# Patient Record
Sex: Male | Born: 1970
Health system: Southern US, Community
[De-identification: ages and names within clinical notes are randomized; demographics above are authoritative.]

## PROBLEM LIST (undated history)

## (undated) DIAGNOSIS — C799 Secondary malignant neoplasm of unspecified site: Secondary | ICD-10-CM

## (undated) DIAGNOSIS — C439 Malignant melanoma of skin, unspecified: Secondary | ICD-10-CM

## (undated) HISTORY — DX: Secondary malignant neoplasm of unspecified site: C79.9

## (undated) HISTORY — DX: Malignant melanoma of skin, unspecified: C43.9

---

## 2020-01-02 ENCOUNTER — Other Ambulatory Visit (HOSPITAL_COMMUNITY): Payer: Self-pay | Admitting: Surgery

## 2020-01-02 ENCOUNTER — Other Ambulatory Visit: Payer: Self-pay | Admitting: Surgery

## 2020-01-02 DIAGNOSIS — R2232 Localized swelling, mass and lump, left upper limb: Secondary | ICD-10-CM

## 2020-01-03 ENCOUNTER — Other Ambulatory Visit: Payer: Self-pay

## 2020-01-03 ENCOUNTER — Ambulatory Visit
Admission: RE | Admit: 2020-01-03 | Discharge: 2020-01-03 | Disposition: A | Discharge status: Home / Self Care | Payer: Self-pay | Source: Ambulatory Visit | Attending: Surgery | Admitting: Surgery

## 2020-01-03 DIAGNOSIS — R2232 Localized swelling, mass and lump, left upper limb: Secondary | ICD-10-CM | POA: Insufficient documentation

## 2020-01-03 MED ORDER — GADOBUTROL 1 MMOL/ML IV SOLN
5.0000 mL | Freq: Once | INTRAVENOUS | Status: AC | PRN
  Administered 2020-01-03: 5 mL via INTRAVENOUS

## 2020-11-21 ENCOUNTER — Telehealth: Payer: Self-pay | Admitting: Nurse Practitioner

## 2020-11-21 NOTE — Telephone Encounter (Signed)
Called patient's brother, Threasa Beards, to offer to schedule a Palliative Consult in the home, no answer - left message with reason for call along with my name and call back number.  Also called patient and left message as well.

## 2020-11-25 ENCOUNTER — Telehealth: Payer: Self-pay | Admitting: Nurse Practitioner

## 2020-11-25 NOTE — Telephone Encounter (Signed)
Spoke with patient's brother, Eddie Dibbles, as well as patient regarding Palliative referral/services.  All questions were answered and patient was in agreement with scheduling visit.  I have scheduled an In-person Consult for 12/02/20 @ 11 AM

## 2020-12-02 ENCOUNTER — Other Ambulatory Visit: Payer: Medicaid Other | Admitting: Nurse Practitioner

## 2020-12-02 ENCOUNTER — Other Ambulatory Visit: Payer: Self-pay

## 2020-12-02 ENCOUNTER — Encounter: Payer: Self-pay | Admitting: Nurse Practitioner

## 2020-12-02 DIAGNOSIS — C799 Secondary malignant neoplasm of unspecified site: Secondary | ICD-10-CM

## 2020-12-02 DIAGNOSIS — Z515 Encounter for palliative care: Secondary | ICD-10-CM

## 2020-12-02 DIAGNOSIS — C439 Malignant melanoma of skin, unspecified: Secondary | ICD-10-CM

## 2020-12-02 NOTE — Progress Notes (Addendum)
[facility] Consult Note Telephone: [phone]  Fax: [phone]  PATIENT NAME: Nicholas Pruitt DOB: 1971-03-16 MRN: [id number]  PRIMARY CARE PROVIDER:   Franklyn Lor, MD  REFERRING PROVIDER:  Franklyn Lor, MD [place],  [place] [address]  RESPONSIBLE PARTY:   Self; Twin brother Nicholas Pruitt  I was asked by Dr Nicholas Pruitt to see Nicholas Pruitt to Palliative care consult for complex medical decision making.  1. Advance Care Planning; Discussed wishes to continue palliative radiation; will revisit goc next visit as Nicholas Pruitt was symptomatic, emotional would be better to further explore 5 wishes/code status at next visit since rapport established  12/03/2020 2:21pm Discussed case with Dr Nicholas Pruitt. After discussion with Dr Nicholas Pruitt would NOT recommend changing Morphine or agent. Please disregard recommendations would not be beneficial with non-compliance. Being initial PC home visit will need to continue to establish rapport to need further exploration of medical goals as with understanding of treatments, metastatic disease progression, wishes for care, possible Hospice when decision is made to stop treatments with ongoing discussion with [facility] team  May consider changing Morphine to Dilaudid or Methadone as he continues to have uncontrolled pain, concern for compliance. Also may consider Haldol for nausea/vomiting. Will contact Dr Nicholas Pruitt with recommendations  2. Goals of Care: Goals include to maximize quality of life and symptom management. Our advance care planning conversation included a discussion about:     The value and importance of advance care planning   Exploration of personal, cultural or spiritual beliefs that might influence medical decisions   Exploration of goals of care in the event of a sudden injury or illness   Identification and preparation of a healthcare agent   Review and  updating or creation of an  advance directive document.  3. Palliative care encounter; Palliative care encounter; Palliative medicine team will continue to support patient, patient's family, and medical team. Visit consisted of counseling and education dealing with the complex and emotionally intense issues of symptom management and palliative care in the setting of serious and potentially life-threatening illness  4. f/u 4 weeks if needed for ongoing discussions of complex medical decision making, nutrition, symptoms of nausea/pain, chronic disease management  I spent 70 minutes providing this consultation,  from 11:00am to 12:10pm. More than 50% of the time in this consultation was spent coordinating communication.   HISTORY OF PRESENT ILLNESS:  Nicholas Pruitt is a 49 y.o. year old male with multiple medical problems including Metastatic melanoma, mass left chest wall.  1 / 2021 with left exhilarating has been growing progressively over the prior-year with workout consistent with metastatic melanoma, PET avid lesions and left axilla, gallbladder fundus. 8 / 17 CT/ pet good response decrease size of exhilarating adenoma resolution of right in lingual nodes, resolution of right fundal gallbladder soft tissue. 11 / 8 / 2021 CT brain - for intracranial disease, CT increase size of left exhilarating and left subpectoral Mass, new soft tissue satellite nodules scheduled for simultaneous radiation oncology for palliative radiation. Recent separation from his wife in 19-Sep-2023 then she passed away due to acute alcohol liver failure. He does have a twin brother Nicholas Pruitt. [employer] POA brother Nicholas Pruitt. Nicholas Pruitt is Widowed. Nicholas Pruitt takes MsContin increase to 45 mg po bid, Oxycodone 10 mg po q3 hours PRN, Duloxetine increase to 60 mg daily, Gabapentin 100 mg tid. Visit on 12 / 15 / 2021 documented struggle with medication management pain  remains under poor control and clear use of prescribed MsContin, using  Oxycodone 5 mg 6 to 8 tablets a day. Severe nausea with vomiting for the last week though resolved. Question whether he is taking Duloxetine, Gabapentin or dexamethasone. 11 / 3 / 2021 albumin 3.6, total protein 7.8. Current weight 97 lbs with BMI 16.2. In-person Palliative initial visit with Nicholas Pruitt. Nicholas Pruitt at present was sitting on the couch in the basement his area where he lives. Nicholas Pruitt and I talked about purpose of Palliative care visit. Nicholas Pruitt in agreement. We talked about past medical history in the setting of chronic disease and progression. We talked about the cancer and current Palliative radiation treatment studies undergoing. We talked about upcoming treatment in 2 days. We talked about Palliative radiation. We talked about progression of cancer. We talked at length about symptoms of pain including current pain regiment. Nicholas Pruitt endorses talked about switching him to a fentanyl patch but that has not happened as of yet. Nicholas Pruitt talked about a possible block that oncology also discussed with him for pain management. We talked about nausea when she does continue to experience. Nicholas Pruitt endorses it is difficult for him to take his pain medication when he is nauseous and vomits. Nicholas Pruitt does try zofran and gets relief at times. Nicholas Pruitt endorses he feels like sometimes when he does not have any food on his stomach he becomes nauseous and vomiting as well as the pain making it worse and unable to eat. Nicholas Pruitt endorses the last few days he has been able to eat a little bit better but this morning he is very nauseous. We also talked about possibly recommending Haldol for the nausea and looking at changing the agent for pain as it appears as he has increased dosages of morphine it does not seem to be controlling his pain as well, possibly considered Dilaudid or methadone. We talked about his appetite, nutrition. We talked about what his daily routine. We talked about  sleep pattern has he shared sleep well but last night he did having weird dreams. Nicholas Pruitt talked about his dreams. We talked about medical goals of care. We talked about quality of life. Mr. Alderman talked at length about his father, twin brother Nicholas Pruitt. Mr. Bills talked about his two children a daughter and son. Mr. Woodmansee endorses his daughter he does not have much to do with as she has a drug problem. Mr. Aja endorses his son just recently graduated from high school. Mr. Mcandrew talked at length about his wife who he was married to for a year-and-a-half has recently passed away in 09/24/2023 due to alcoholism. Mr. Beever talked a lot about his life overall, mistakes that he has made in the past. We talked at length about quality of life. Most of Palliative visits supportive. Discussed coping strategies. We talked about role of Palliative care and plan of care. We talked about follow up Palliative care visit in 4 weeks if needed or soon as should he declined. Mr. Waddell in agreement, appointment schedule. Therapeutic listening, emotional support provided. Contact information. Questions answered just satisfaction. I spoke separately with Nicholas Pruitt his brother. We talked about Palliative care visit. We talked about resources that are available in addition to support. Nicholas Pruitt expressed he was thankful for Palliative care visit.  Palliative Care was asked to help address goals of care.   PPS: 50% HOSPICE ELIGIBILITY/DIAGNOSIS: TBD  PAST MEDICAL HISTORY: History reviewed. No pertinent past medical history.  SOCIAL HX:  Social History   Tobacco Use  . Smoking status: Not on file  . Smokeless tobacco: Not on file  Substance Use Topics  . Alcohol use: Not on file    ALLERGIES: No Known Allergies   PERTINENT MEDICATIONS:  No outpatient encounter medications on file as of 12/02/2020.   No facility-administered encounter medications on file as of 12/02/2020.    PHYSICAL EXAM:   General: frail  appearing, thin, male Cardiovascular: regular rate and rhythm Pulmonary: few wheezes Extremities: no edema, no joint deformities; muscle wasting Neurological: generalized weakness  Davier Tramell Ihor Gully, NP

## 2020-12-03 ENCOUNTER — Telehealth: Payer: Self-pay | Admitting: Nurse Practitioner

## 2020-12-03 NOTE — Telephone Encounter (Signed)
I called Dr Lolita Patella Palliative [facility], message left to return call pertaining to update on home PC initial visit.

## 2020-12-31 ENCOUNTER — Other Ambulatory Visit: Payer: Self-pay

## 2020-12-31 ENCOUNTER — Encounter: Payer: Self-pay | Admitting: Nurse Practitioner

## 2020-12-31 ENCOUNTER — Other Ambulatory Visit: Payer: Medicaid Other | Admitting: Nurse Practitioner

## 2020-12-31 DIAGNOSIS — Z515 Encounter for palliative care: Secondary | ICD-10-CM

## 2020-12-31 DIAGNOSIS — C439 Malignant melanoma of skin, unspecified: Secondary | ICD-10-CM

## 2020-12-31 DIAGNOSIS — C799 Secondary malignant neoplasm of unspecified site: Secondary | ICD-10-CM

## 2020-12-31 NOTE — Progress Notes (Signed)
[facility] Consult Note Telephone: [phone]  Fax: [phone]  PATIENT NAME: Nicholas Pruitt DOB: 11/09/1971 MRN: [id number]  PRIMARY CARE PROVIDER:   Franklyn Lor, MD  REFERRING PROVIDER:  Franklyn Lor, MD [place],  [place] [address]   RESPONSIBLE PARTY:   Self; Twin brother Ibrahem Volkman  1.Advance Care Planning; Full code, aggressive interventions  2. Pain/nause with vomiting, managed by [facility] team,   3. Goals of Care: Goals include to maximize quality of life and symptom management. Our advance care planning conversation included a discussion about:   The value and importance of advance care planning  Exploration of personal, cultural or spiritual beliefs that might influence medical decisions  Exploration of goals of care in the event of a sudden injury or illness  Identification and preparation of a healthcare agent  Review and updating or creation of anadvance directive document.  4.Palliative care encounter; Palliative care encounter; Palliative medicine team will continue to support patient, patient's family, and medical team. Visit consisted of counseling and education dealing with the complex and emotionally intense issues of symptom management and palliative care in the setting of serious and potentially life-threatening illness  5. f/u 4 weeks if needed for ongoing discussions of complex medical decision making, nutrition, symptoms of nausea/pain, chronic disease management  I spent 35 minutes providing this consultation,  from 10:00am to 10:30am. More than 50% of the time in this consultation was spent coordinating communication.   HISTORY OF PRESENT ILLNESS:  Rogerio Boutelle is a 50 y.o. year old male with multiple medical problems including Metastatic melanoma, mass left chest wall.  1 / 2021 with left exhilarating has been growing progressively over the prior-year with  workout consistent with metastatic melanoma, PET avid lesions and left axilla, gallbladder fundus. I called Mr. Estill Bakes for Palliative care follow-up visit confirm change to telemedicine telephonic is video not available. Mr Estill Bakes and I talked about recent visits at [facility] with Oncology. We talked about his wishes to possibly transfer to a closer Oncologist as it is becoming more difficult for him to travel come and get rides and gas prices. We talked about his upcoming appointment scheduled to have a block placed at [facility] for pain management. We talked about starting Fentanyl patch. Mr Estill Bakes endorses he feels like this may be beneficial. Mr. Estill Bakes endorses that it is helping his pain. Mr Estill Bakes and I talked about his appetite which remains declined. Mr Estill Bakes endorses he weighs 98 lb. Mr Estill Bakes endorses he used to drink a lot of alcohol in his past but has not been quite some time since his wife is passed. Mr Estill Bakes endorses he did not like sweets in the past but seems to Crave them now. Mr Estill Bakes endorses he has been eating a fair amount of candy. Mr Estill Bakes talked about his brother going to [employer] and getting some donuts for him. Mr Estill Bakes endorses he ate donuts, then eating stew that is brother made and threw it up. We talked about food that had substance and protein. We talked about limiting sweets, moderation. We talked about the nausea and vomiting. We talked about his current regimen. Mr Estill Bakes endorses early in the morning is harder as well as sudden movements crates nausea. We talked about the block hopefully helping. We talked about his mobility is he is very slow to move due to the tumor causing pain. We talked about physical therapy who had been working with him.  We talked about medical goals at care including aggressive versus conservative versus comfort care. We talked about upcoming treatments. We talked about role of Palliative care at length. We talked about family dynamics his relationship with his  brother and father. We talked about coping strategies including meditation. We talked about taking things one day at a time. We talked about expectations for self and self care. We talked about follow-up palliative care visit and 1 months if needed or sooner should he declined. Mr Estill Bakes endorses he was call the [facility] in [place] as it appears they did do a referral the schedule initial consultation. Appointment schedule per request. Therapeutic listening and emotional support provided. Palliative care visit today supportive counseling. Questions answered to satisfaction. Contact information provided.  Palliative Care was asked to help to continue to address goals of care.   CODE STATUS: Full code  PPS: 40% HOSPICE ELIGIBILITY/DIAGNOSIS: TBD  PAST MEDICAL HISTORY: History reviewed. No pertinent past medical history.  SOCIAL HX:  Social History   Tobacco Use  . Smoking status: Not on file  . Smokeless tobacco: Not on file  Substance Use Topics  . Alcohol use: Not on file    ALLERGIES: No Known Allergies   PERTINENT MEDICATIONS:  No outpatient encounter medications on file as of 12/31/2020.   No facility-administered encounter medications on file as of 12/31/2020.    PHYSICAL EXAM:   Deferred  Dashay Giesler Z Ruthetta Koopmann, NP

## 2021-01-08 ENCOUNTER — Other Ambulatory Visit: Payer: Self-pay | Admitting: Oncology

## 2021-01-08 ENCOUNTER — Encounter (INDEPENDENT_AMBULATORY_CARE_PROVIDER_SITE_OTHER): Payer: Self-pay

## 2021-01-08 ENCOUNTER — Encounter: Payer: Self-pay | Admitting: Oncology

## 2021-01-08 ENCOUNTER — Inpatient Hospital Stay: Payer: 59 | Attending: Oncology | Admitting: Oncology

## 2021-01-08 ENCOUNTER — Inpatient Hospital Stay: Payer: 59

## 2021-01-08 VITALS — BP 109/78 | HR 120 | Temp 98.5°F | Resp 18 | Wt 95.7 lb

## 2021-01-08 DIAGNOSIS — C439 Malignant melanoma of skin, unspecified: Secondary | ICD-10-CM | POA: Diagnosis present

## 2021-01-08 DIAGNOSIS — Z803 Family history of malignant neoplasm of breast: Secondary | ICD-10-CM | POA: Diagnosis not present

## 2021-01-08 DIAGNOSIS — Z79899 Other long term (current) drug therapy: Secondary | ICD-10-CM | POA: Insufficient documentation

## 2021-01-08 DIAGNOSIS — R112 Nausea with vomiting, unspecified: Secondary | ICD-10-CM | POA: Diagnosis not present

## 2021-01-08 DIAGNOSIS — C774 Secondary and unspecified malignant neoplasm of inguinal and lower limb lymph nodes: Secondary | ICD-10-CM | POA: Diagnosis present

## 2021-01-08 DIAGNOSIS — R634 Abnormal weight loss: Secondary | ICD-10-CM

## 2021-01-08 DIAGNOSIS — G893 Neoplasm related pain (acute) (chronic): Secondary | ICD-10-CM

## 2021-01-08 DIAGNOSIS — Z8051 Family history of malignant neoplasm of kidney: Secondary | ICD-10-CM | POA: Insufficient documentation

## 2021-01-08 DIAGNOSIS — C7989 Secondary malignant neoplasm of other specified sites: Secondary | ICD-10-CM | POA: Diagnosis not present

## 2021-01-08 DIAGNOSIS — R1110 Vomiting, unspecified: Secondary | ICD-10-CM | POA: Insufficient documentation

## 2021-01-08 DIAGNOSIS — C799 Secondary malignant neoplasm of unspecified site: Secondary | ICD-10-CM

## 2021-01-08 DIAGNOSIS — F17210 Nicotine dependence, cigarettes, uncomplicated: Secondary | ICD-10-CM | POA: Insufficient documentation

## 2021-01-08 DIAGNOSIS — Z7189 Other specified counseling: Secondary | ICD-10-CM

## 2021-01-08 LAB — CBC WITH DIFFERENTIAL/PLATELET
Abs Immature Granulocytes: 0.03 10*3/uL (ref 0.00–0.07)
Basophils Absolute: 0 10*3/uL (ref 0.0–0.1)
Basophils Relative: 0 %
Eosinophils Absolute: 0.3 10*3/uL (ref 0.0–0.5)
Eosinophils Relative: 4 %
HCT: 30.5 % — ABNORMAL LOW (ref 39.0–52.0)
Hemoglobin: 9.2 g/dL — ABNORMAL LOW (ref 13.0–17.0)
Immature Granulocytes: 0 %
Lymphocytes Relative: 12 %
Lymphs Abs: 0.9 10*3/uL (ref 0.7–4.0)
MCH: 24 pg — ABNORMAL LOW (ref 26.0–34.0)
MCHC: 30.2 g/dL (ref 30.0–36.0)
MCV: 79.6 fL — ABNORMAL LOW (ref 80.0–100.0)
Monocytes Absolute: 0.9 10*3/uL (ref 0.1–1.0)
Monocytes Relative: 11 %
Neutro Abs: 5.4 10*3/uL (ref 1.7–7.7)
Neutrophils Relative %: 73 %
Platelets: 439 10*3/uL — ABNORMAL HIGH (ref 150–400)
RBC: 3.83 MIL/uL — ABNORMAL LOW (ref 4.22–5.81)
RDW: 13.8 % (ref 11.5–15.5)
WBC: 7.6 10*3/uL (ref 4.0–10.5)
nRBC: 0 % (ref 0.0–0.2)

## 2021-01-08 LAB — COMPREHENSIVE METABOLIC PANEL
ALT: 5 U/L (ref 0–44)
AST: 17 U/L (ref 15–41)
Albumin: 3.3 g/dL — ABNORMAL LOW (ref 3.5–5.0)
Alkaline Phosphatase: 56 U/L (ref 38–126)
Anion gap: 14 (ref 5–15)
BUN: 9 mg/dL (ref 6–20)
CO2: 26 mmol/L (ref 22–32)
Calcium: 9.6 mg/dL (ref 8.9–10.3)
Chloride: 97 mmol/L — ABNORMAL LOW (ref 98–111)
Creatinine, Ser: 0.5 mg/dL — ABNORMAL LOW (ref 0.61–1.24)
GFR, Estimated: >60 mL/min (ref >60)
Glucose, Bld: 79 mg/dL (ref 70–99)
Potassium: 3.8 mmol/L (ref 3.5–5.1)
Sodium: 137 mmol/L (ref 135–145)
Total Bilirubin: 0.4 mg/dL (ref 0.3–1.2)
Total Protein: 8.3 g/dL — ABNORMAL HIGH (ref 6.5–8.1)

## 2021-01-08 LAB — LACTATE DEHYDROGENASE: LDH: 423 U/L — ABNORMAL HIGH (ref 98–192)

## 2021-01-08 MED ORDER — ONDANSETRON 8 MG PO TBDP: 8.0000 mg | ORAL_TABLET | Freq: Three times a day (TID) | ORAL | 0 refills | Status: AC | PRN

## 2021-01-08 NOTE — Progress Notes (Signed)
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

## 2021-01-08 NOTE — Progress Notes (Signed)
Patient here to establish care for metastatic melanoma. Transferring care from [facility] due to transportation issues.

## 2021-01-08 NOTE — Progress Notes (Signed)
Hematology/Oncology Consult note [facility] Telephone:([phone] [phone]   Patient Care Team: Franklyn Lor, MD as PCP - General (Internal Medicine)  REFERRING PROVIDER: Clemon Chambers, MD  CHIEF COMPLAINTS/REASON FOR VISIT:  Evaluation of metastatic melanoma.   HISTORY OF PRESENTING ILLNESS:   Nicholas Pruitt is a  50 y.o.  male with PMH listed below was seen in consultation at the request of  Clemon Chambers, MD  for evaluation of metastatic melanoma.  Patient's oncology care was previously at [facility]. Most history was obtained from reviewing her records via careeverywhere as patient does not feel well due to the pain.  He was accompanied by his twin brother who he lives with.   1. Metastatic Melanoma, axillary mass, left A. 01/02/20 Dr. Lysle Pearl, evaluation of axillary mass, left B. 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 C. 01/24/20 Dr. Lorayne Marek (orthopedic surgery), seen in evaluation for further evaluation-need for bx D. 01/25/20: CT Chest: at least 2 large discrete oval left axillary and left supectoral masses, slightly increased in size compared to 01/03/20 MRI. Left upper lobe 5 mm groundglass pulmonary nodule is inderterminate-f/u recommended. E. 01/29/20: Dr. Lorayne Marek, Left axilla mass, biopsy: Most consistent with metastatic melanoma -diffusely positive for SOX10, focal positive for S100 and negative for pancytokeratin and TTF1 -Immunohistochemistry for Mart/HMB45 and MiTF is strong and diffusely positive, further supporting the diagnosis of metastatic melanoma. - [id number] F. 02/14/20: PET/CT:large left axillary and left subpectoral hypermetabolic mass, consistent with biopsy proven melanoma. Hypermetabolic soft tissue mass in gallbladder fundus concerning for metastatic or primary malignancy. Metastatic right inguinal lymph nodes.  Hypermetabolic left thyroid lobe nodule. Small left upper lobe ground glass nodule without FDG activity.   G. 04/03/20: Encorafenib/binimetinib recently approved with financial assistance. Pain management discussed.  H. 04/17/20 Followup visit. Taking medications incorrectly. Went over dosing with encorafenib 6 pills daily and binimetinib 3 tablets twice a day. Pain has improved. Left axillary mass has improved.  I. 06/03/20 Ct c/a/p: increased size and central necrosis of the subpectoral component of the left axillary mass, lateral axillary component unchanged, decrease in size in right inguinal adenopathy J. 07/02/20 Echo: LV ejection fraction: 50%. Enco/bini 450/45mg , hold treatment for 3 days, restart at 300/30 mg. Written instructions given. K. 07/30/20 PET CT: decrease in size of axillary adenopathy, resolution of R inguinal node, resolution of R fundal gallbladder soft tissue L. 09/11/20 ECHO: Ejection fraction 44% Enco/bini 300/30 mg will restart in next few days. Change in specialty pharmacy, enco/bini on hold for last three weeks.  M. 10/16/20 followup visit. Saw XRT prior to seeing me. Started enco/bini 09/13/20, not taking consistently due to nausea/vomiting so has missed several doses. Referred to palliative, instructed to stop enco/bini as he was not deriving benefit  N. 10/21/20 CT brain- no intracranial disease CT c/a/p: Increased size and enhancement of left axillary and left subpectoral mass which is now confluent. now measuring at least 10.6 x 7.8 x 12.2 cm, previously 6.7 x 8.1 x 10.6 cm. Several new small soft tissue satellite nodules. O. 10/30/20 XRT to axilla  P. 11/27/20 follow up visit. He has completed 2 of 3 doses of radiation to left axilla.  Q. 12/16/20 completed radiation  Pain, left axially mass pain, he is on Fentanyl Patch 59mcg /h every 72 hours, and oxycodone 10 mg every 4 hours. Still has 7-8 out of 10 pain, radiate to his anterior chest wall and  left flank.   Nausea,  currently on Zofran and compazine. He reports antiemetics are not working as he vomits the medication out. He smokes Marijuana daily.    Review of Systems  Constitutional: Positive for appetite change, fatigue and unexpected weight change. Negative for chills and fever.  HENT:   Negative for hearing loss and voice change.   Eyes: Negative for eye problems and icterus.  Respiratory: Negative for chest tightness, cough and shortness of breath.   Cardiovascular: Negative for chest pain and leg swelling.  Gastrointestinal: Positive for nausea and vomiting. Negative for abdominal distention and abdominal pain.  Endocrine: Negative for hot flashes.  Genitourinary: Negative for difficulty urinating, dysuria and frequency.   Musculoskeletal: Negative for arthralgias.       Left axillary mass pain  Skin: Negative for itching and rash.  Neurological: Negative for light-headedness and numbness.  Hematological: Negative for adenopathy. Does not bruise/bleed easily.  Psychiatric/Behavioral: Negative for confusion.    MEDICAL HISTORY:  Past Medical History:  Diagnosis Date  . Metastatic melanoma ([place])     SURGICAL HISTORY: History reviewed. No pertinent surgical history.  SOCIAL HISTORY: Social History   Socioeconomic History  . Marital status: Widowed    Spouse name: Not on file  . Number of children: Not on file  . Years of education: Not on file  . Highest education level: Not on file  Occupational History  . Occupation: disabled  Tobacco Use  . Smoking status: Current Every Day Smoker    Packs/day: 1.50    Years: 20.00    Pack years: 30.00    Types: Cigarettes  . Smokeless tobacco: Never Used  Vaping Use  . Vaping Use: Never used  Substance and Sexual Activity  . Alcohol use: Not Currently  . Drug use: Yes    Types: Marijuana    Comment: smokes marijuana daily  . Sexual activity: Not on file  Other Topics Concern  . Not on file  Social History Narrative  . Not on file    Social Determinants of Health   Financial Resource Strain: Not on file  Food Insecurity: Not on file  Transportation Needs: Not on file  Physical Activity: Not on file  Stress: Not on file  Social Connections: Not on file  Intimate Partner Violence: Not on file    FAMILY HISTORY: Family History  Problem Relation Age of Onset  . Breast cancer Mother   . Kidney cancer Mother     ALLERGIES:  has No Known Allergies.  MEDICATIONS:  Current Outpatient Medications  Medication Sig Dispense Refill  . Docusate Sodium (DSS) 100 MG CAPS Take 100 mg by mouth in the morning and at bedtime.    . fentaNYL (DURAGESIC) 25 MCG/HR PLACE 1 PATCH ONTO THE SKIN EVERY THIRD DAY    . gabapentin (NEURONTIN) 100 MG capsule Take 100 mg by mouth in the morning, at noon, and at bedtime.    . naloxone (NARCAN) nasal spray 4 mg/0.1 mL Place into the nose. Place 1 spray (4 mg total) into one nostril once as needed (if not breathing or overdose is suspected.) for up to 1 dose Give 2nd dose in 5-10 min if not responding or if sx return for up to 1 dose    . ondansetron (ZOFRAN ODT) 8 MG disintegrating tablet Take 1 tablet (8 mg total) by mouth every 8 (eight) hours as needed for nausea or vomiting. 90 tablet 0  . oxyCODONE (OXY IR/ROXICODONE) 5 MG immediate release tablet Take by  mouth. Take 1-2 tablets (5-10 mg total) by mouth every 4 (four) hours as needed (cancer related pain    . potassium chloride (KLOR-CON) 10 MEQ tablet Take 10 mEq by mouth 2 (two) times daily.    . prochlorperazine (COMPAZINE) 10 MG tablet Take by mouth. Take 1 tablet (10 mg total) by mouth every 6 (six) hours as needed for Nausea     No current facility-administered medications for this visit.     PHYSICAL EXAMINATION: ECOG PERFORMANCE STATUS: 1 - Symptomatic but completely ambulatory Vitals:   01/08/21 1534  BP: 109/78  Pulse: (!) 120  Resp: 18  Temp: 98.5 F (36.9 C)   Filed Weights   01/08/21 1534  Weight: 95 lb 11.2 oz  (43.4 kg)    Physical Exam Constitutional:      Comments: Some distress due to the pain.  HENT:     Head: Normocephalic.  Eyes:     General: No scleral icterus. Cardiovascular:     Rate and Rhythm: Normal rate and regular rhythm.     Heart sounds: Normal heart sounds.  Pulmonary:     Effort: Pulmonary effort is normal. No respiratory distress.     Breath sounds: No wheezing.  Abdominal:     General: Bowel sounds are normal. There is no distension.     Palpations: Abdomen is soft.  Musculoskeletal:        General: No deformity. Normal range of motion.     Cervical back: Normal range of motion and neck supple.     Comments: Left axillary mass with focal erythema. Tender with palpation.   Skin:    General: Skin is warm and dry.  Neurological:     Mental Status: He is alert and oriented to person, place, and time. Mental status is at baseline.     Cranial Nerves: No cranial nerve deficit.     Coordination: Coordination normal.       LABORATORY DATA:  I have reviewed the data as listed Lab Results  Component Value Date   WBC 7.6 01/08/2021   HGB 9.2 (L) 01/08/2021   HCT 30.5 (L) 01/08/2021   MCV 79.6 (L) 01/08/2021   PLT 439 (H) 01/08/2021   Recent Labs    01/08/21 1627  NA 137  K 3.8  CL 97*  CO2 26  GLUCOSE 79  BUN 9  CREATININE 0.50*  CALCIUM 9.6  GFRNONAA >60  PROT 8.3*  ALBUMIN 3.3*  AST 17  ALT 5  ALKPHOS 56  BILITOT 0.4   Iron/TIBC/Ferritin/ %Sat No results found for: IRON, TIBC, FERRITIN, IRONPCTSAT    RADIOGRAPHIC STUDIES: I have personally reviewed the radiological images as listed and agreed with the findings in the report. No results found.  I reviewed his previous CT and PET scans that were done at [facility]. Images were not available.  ASSESSMENT & PLAN:  1. Metastatic melanoma ([place])   2. Goals of care, counseling/discussion   3. Non-intractable vomiting with nausea, unspecified vomiting type   4. Weight loss   5. Neoplasm related pain    Cancer Staging Metastatic melanoma [facility]) Staging form: Melanoma of the Skin, AJCC 8th Edition - Clinical stage from 01/08/2021: Stage IV (ycTX, cNX, cM1) - Signed by Earlie Server, MD on 01/08/2021   # Stage IV metastatic melanoma, BRAFV600E Progressed on 1st treatment with Encorafenib/binimetinib, s/p palliative RT.  The diagnosis and care plan were discussed with patient in detail.  NCCN guidelines were reviewed and shared with patient.   The  goal of treatment which is to palliate disease, disease related symptoms, improve quality of life and hopefully prolong life was highlighted in our discussion.  Chemotherapy education was provided.  We had discussed the composition of chemotherapy regimen, length of chemo cycle, duration of treatment and the time to assess response to treatment.    I discussed the mechanism of action and rationale of using immunotherapy Nivolumab and ipilimumab.  The goal of therapy is palliative; and length of treatments are likely ongoing/based upon the results of the scans. Discussed the potential side effects of immunotherapy including but not limited to allergic reaction, diarrhea; skin rash; respiratory failure, kidney failure, mental status change, elevated LFTs/liver failure,endocrine abnormalities, acute deterioration  and even death,etc. patient voices understanding and agrees with proceeding with treatment.  Patient voices understanding and willing to proceed immunotherapy. He has had CT scheduled at [facility] and I recommend him to keep his appointment. Will obtain his images.  # Chemotherapy education # Nausea/vomiting He reports previously Zofran ODT works but currently he get Zofran tablets which he has difficulty keeping down.  Switch to Zofran ODT 8mg  Q8hours as needed.  Also discussed other options of antiemetic, phenergan suppository, IV antiemetics and he is not interested at this point.  # Neoplasm related pain.  Recommend him to continue Oxycodone 5-10mg  Q4  hours as needed.  Increase fentanyl patch to 26mcg/h every 72 hours.  Currently on gabapentin 100mg  TID and I recommend him to increase to 200mg  TID and can further titrate up if he tolerates. I did ask him to increase fentanyl patch dose first, if tolerate, then increase gabapentin.  Recommend NSAID and he is not interested.  # Weight loss, recommend nutrition supplement.  # refer to establish care with palliative care service.   Check CBC, CMP LDH,  Follow up next week, hopefully to start first cycle of Nivolumab and Ipilimumab Supportive care measures are necessary for patient well-being and will be provided as necessary. We spent sufficient time to discuss many aspect of care, questions were answered to patient's satisfaction.   Orders Placed This Encounter  Procedures  . CBC with Differential/Platelet    Standing Status:   Future    Number of Occurrences:   1    Standing Expiration Date:   01/08/2022  . Comprehensive metabolic panel    Standing Status:   Future    Number of Occurrences:   1    Standing Expiration Date:   01/08/2022  . Lactate dehydrogenase    Standing Status:   Future    Number of Occurrences:   1    Standing Expiration Date:   01/08/2022  . Ambulatory Referral to Palliative Care    Referral Priority:   Routine    Referral Type:   Consultation    Number of Visits Requested:   1    All questions were answered. The patient knows to call the clinic with any problems questions or concerns.  cc Clemon Chambers, MD   Thank you for this kind referral and the opportunity to participate in the care of this patient. A copy of today's note is routed to referring provider    Earlie Server, MD, PhD Hematology Oncology [facility] Pager- [phone] 01/08/2021

## 2021-01-10 ENCOUNTER — Other Ambulatory Visit: Payer: 59

## 2021-01-10 NOTE — Patient Instructions (Signed)
Ipilimumab injection What is this medicine? IPILIMUMAB (IP i LIM ue mab) is a monoclonal antibody. It is used to treat colorectal cancer, kidney cancer, liver cancer, lung cancer, melanoma, and mesothelioma. This medicine may be used for other purposes; ask your health care provider or pharmacist if you have questions. COMMON BRAND NAME(S): YERVOY What should I tell my health care provider before I take this medicine? They need to know if you have any of these conditions:  autoimmune diseases like Crohn's disease, ulcerative colitis, or lupus  have had or planning to have an allogeneic stem cell transplant (uses someone else's stem cells)  history of organ transplant  nervous system problems like myasthenia gravis or Guillain-Barre syndrome  an unusual or allergic reaction to ipilimumab, other medicines, foods, dyes, or preservatives  pregnant or trying to get pregnant  breast-feeding How should I use this medicine? This medicine is for infusion into a vein. It is given by a health care professional in a hospital or clinic setting. A special MedGuide will be given to you before each treatment. Be sure to read this information carefully each time. Talk to your pediatrician regarding the use of this medicine in children. While this drug may be prescribed for children as young as 12 years for selected conditions, precautions do apply. Overdosage: If you think you have taken too much of this medicine contact a poison control center or emergency room at once. NOTE: This medicine is only for you. Do not share this medicine with others. What if I miss a dose? It is important not to miss your dose. Call your doctor or health care professional if you are unable to keep an appointment. What may interact with this medicine? Interactions are not expected. This list may not describe all possible interactions. Give your health care provider a list of all the medicines, herbs, non-prescription drugs,  or dietary supplements you use. Also tell them if you smoke, drink alcohol, or use illegal drugs. Some items may interact with your medicine. What should I watch for while using this medicine? Tell your doctor or healthcare professional if your symptoms do not start to get better or if they get worse. Do not become pregnant while taking this medicine or for 3 months after stopping it. Women should inform their doctor if they wish to become pregnant or think they might be pregnant. There is a potential for serious side effects to an unborn child. Talk to your health care professional or pharmacist for more information. Do not breast-feed an infant while taking this medicine or for 3 months after the last dose. Your condition will be monitored carefully while you are receiving this medicine. You may need blood work done while you are taking this medicine. What side effects may I notice from receiving this medicine? Side effects that you should report to your doctor or health care professional as soon as possible:  allergic reactions like skin rash, itching or hives, swelling of the face, lips, or tongue  black, tarry stools  bloody or watery diarrhea  changes in vision  dizziness  eye pain  fast, irregular heartbeat  feeling anxious  feeling faint or lightheaded, falls  nausea, vomiting  pain, tingling, numbness in the hands or feet  redness, blistering, peeling or loosening of the skin, including inside the mouth  signs and symptoms of liver injury like dark yellow or brown urine; general ill feeling or flu-like symptoms; light-colored stools; loss of appetite; nausea; right upper belly pain; unusually weak  or tired; yellowing of the eyes or skin  unusual bleeding or bruising Side effects that usually do not require medical attention (report to your doctor or health care professional if they continue or are bothersome):  headache  loss of appetite  trouble sleeping This list  may not describe all possible side effects. Call your doctor for medical advice about side effects. You may report side effects to FDA at 1-800-FDA-1088. Where should I keep my medicine? This drug is given in a hospital or clinic and will not be stored at home. NOTE: This sheet is a summary. It may not cover all possible information. If you have questions about this medicine, talk to your doctor, pharmacist, or health care provider.  2021 Elsevier/Gold Standard (2019-11-01 18:53:00) Nivolumab injection What is this medicine? NIVOLUMAB (nye VOL ue mab) is a monoclonal antibody. It treats certain types of cancer. Some of the cancers treated are colon cancer, head and neck cancer, Hodgkin lymphoma, lung cancer, and melanoma. This medicine may be used for other purposes; ask your health care provider or pharmacist if you have questions. COMMON BRAND NAME(S): Opdivo What should I tell my health care provider before I take this medicine? They need to know if you have any of these conditions:  autoimmune diseases like Crohn's disease, ulcerative colitis, or lupus  have had or planning to have an allogeneic stem cell transplant (uses someone else's stem cells)  history of chest radiation  history of organ transplant  nervous system problems like myasthenia gravis or Guillain-Barre syndrome  an unusual or allergic reaction to nivolumab, other medicines, foods, dyes, or preservatives  pregnant or trying to get pregnant  breast-feeding How should I use this medicine? This medicine is for infusion into a vein. It is given by a health care professional in a hospital or clinic setting. A special MedGuide will be given to you before each treatment. Be sure to read this information carefully each time. Talk to your pediatrician regarding the use of this medicine in children. While this drug may be prescribed for children as young as 12 years for selected conditions, precautions do apply. Overdosage:  If you think you have taken too much of this medicine contact a poison control center or emergency room at once. NOTE: This medicine is only for you. Do not share this medicine with others. What if I miss a dose? It is important not to miss your dose. Call your doctor or health care professional if you are unable to keep an appointment. What may interact with this medicine? Interactions have not been studied. This list may not describe all possible interactions. Give your health care provider a list of all the medicines, herbs, non-prescription drugs, or dietary supplements you use. Also tell them if you smoke, drink alcohol, or use illegal drugs. Some items may interact with your medicine. What should I watch for while using this medicine? This drug may make you feel generally unwell. Continue your course of treatment even though you feel ill unless your doctor tells you to stop. You may need blood work done while you are taking this medicine. Do not become pregnant while taking this medicine or for 5 months after stopping it. Women should inform their doctor if they wish to become pregnant or think they might be pregnant. There is a potential for serious side effects to an unborn child. Talk to your health care professional or pharmacist for more information. Do not breast-feed an infant while taking this medicine or for  5 months after stopping it. What side effects may I notice from receiving this medicine? Side effects that you should report to your doctor or health care professional as soon as possible:  allergic reactions like skin rash, itching or hives, swelling of the face, lips, or tongue  breathing problems  blood in the urine  bloody or watery diarrhea or black, tarry stools  changes in emotions or moods  changes in vision  chest pain  cough  dizziness  feeling faint or lightheaded, falls  fever, chills  headache with fever, neck stiffness, confusion, loss of memory,  sensitivity to light, hallucination, loss of contact with reality, or seizures  joint pain  mouth sores  redness, blistering, peeling or loosening of the skin, including inside the mouth  severe muscle pain or weakness  signs and symptoms of high blood sugar such as dizziness; dry mouth; dry skin; fruity breath; nausea; stomach pain; increased hunger or thirst; increased urination  signs and symptoms of kidney injury like trouble passing urine or change in the amount of urine  signs and symptoms of liver injury like dark yellow or brown urine; general ill feeling or flu-like symptoms; light-colored stools; loss of appetite; nausea; right upper belly pain; unusually weak or tired; yellowing of the eyes or skin  swelling of the ankles, feet, hands  trouble passing urine or change in the amount of urine  unusually weak or tired  weight gain or loss Side effects that usually do not require medical attention (report to your doctor or health care professional if they continue or are bothersome):  bone pain  constipation  decreased appetite  diarrhea  muscle pain  nausea, vomiting  tiredness This list may not describe all possible side effects. Call your doctor for medical advice about side effects. You may report side effects to FDA at 1-800-FDA-1088. Where should I keep my medicine? This drug is given in a hospital or clinic and will not be stored at home. NOTE: This sheet is a summary. It may not cover all possible information. If you have questions about this medicine, talk to your doctor, pharmacist, or health care provider.  2021 Elsevier/Gold Standard (2020-04-03 10:08:25)

## 2021-01-13 ENCOUNTER — Inpatient Hospital Stay: Payer: 59

## 2021-01-16 ENCOUNTER — Inpatient Hospital Stay (HOSPITAL_BASED_OUTPATIENT_CLINIC_OR_DEPARTMENT_OTHER): Payer: 59 | Admitting: Oncology

## 2021-01-16 ENCOUNTER — Inpatient Hospital Stay: Payer: 59 | Admitting: Hospice and Palliative Medicine

## 2021-01-16 ENCOUNTER — Encounter: Payer: Self-pay | Admitting: Oncology

## 2021-01-16 ENCOUNTER — Inpatient Hospital Stay: Payer: 59

## 2021-01-16 ENCOUNTER — Telehealth: Payer: Self-pay

## 2021-01-16 VITALS — BP 114/77 | HR 106 | Temp 98.0°F | Resp 18 | Wt 89.6 lb

## 2021-01-16 DIAGNOSIS — C779 Secondary and unspecified malignant neoplasm of lymph node, unspecified: Secondary | ICD-10-CM | POA: Insufficient documentation

## 2021-01-16 DIAGNOSIS — E46 Unspecified protein-calorie malnutrition: Secondary | ICD-10-CM | POA: Insufficient documentation

## 2021-01-16 DIAGNOSIS — Z923 Personal history of irradiation: Secondary | ICD-10-CM | POA: Insufficient documentation

## 2021-01-16 DIAGNOSIS — C799 Secondary malignant neoplasm of unspecified site: Secondary | ICD-10-CM

## 2021-01-16 DIAGNOSIS — R5383 Other fatigue: Secondary | ICD-10-CM | POA: Insufficient documentation

## 2021-01-16 DIAGNOSIS — C773 Secondary and unspecified malignant neoplasm of axilla and upper limb lymph nodes: Secondary | ICD-10-CM | POA: Diagnosis present

## 2021-01-16 DIAGNOSIS — C439 Malignant melanoma of skin, unspecified: Secondary | ICD-10-CM

## 2021-01-16 DIAGNOSIS — R634 Abnormal weight loss: Secondary | ICD-10-CM | POA: Insufficient documentation

## 2021-01-16 DIAGNOSIS — C4359 Malignant melanoma of other part of trunk: Secondary | ICD-10-CM | POA: Insufficient documentation

## 2021-01-16 DIAGNOSIS — Z510 Encounter for antineoplastic radiation therapy: Secondary | ICD-10-CM | POA: Insufficient documentation

## 2021-01-16 DIAGNOSIS — F17210 Nicotine dependence, cigarettes, uncomplicated: Secondary | ICD-10-CM | POA: Insufficient documentation

## 2021-01-16 DIAGNOSIS — G893 Neoplasm related pain (acute) (chronic): Secondary | ICD-10-CM | POA: Insufficient documentation

## 2021-01-16 DIAGNOSIS — R112 Nausea with vomiting, unspecified: Secondary | ICD-10-CM | POA: Diagnosis not present

## 2021-01-16 DIAGNOSIS — R531 Weakness: Secondary | ICD-10-CM | POA: Insufficient documentation

## 2021-01-16 DIAGNOSIS — Z5112 Encounter for antineoplastic immunotherapy: Secondary | ICD-10-CM | POA: Insufficient documentation

## 2021-01-16 DIAGNOSIS — Z515 Encounter for palliative care: Secondary | ICD-10-CM | POA: Insufficient documentation

## 2021-01-16 DIAGNOSIS — Z79899 Other long term (current) drug therapy: Secondary | ICD-10-CM | POA: Insufficient documentation

## 2021-01-16 DIAGNOSIS — Z7189 Other specified counseling: Secondary | ICD-10-CM

## 2021-01-16 DIAGNOSIS — Z9221 Personal history of antineoplastic chemotherapy: Secondary | ICD-10-CM | POA: Insufficient documentation

## 2021-01-16 LAB — CBC WITH DIFFERENTIAL/PLATELET
Abs Immature Granulocytes: 0.09 10*3/uL — ABNORMAL HIGH (ref 0.00–0.07)
Basophils Absolute: 0 10*3/uL (ref 0.0–0.1)
Basophils Relative: 0 %
Eosinophils Absolute: 0.3 10*3/uL (ref 0.0–0.5)
Eosinophils Relative: 4 %
HCT: 30.3 % — ABNORMAL LOW (ref 39.0–52.0)
Hemoglobin: 9.2 g/dL — ABNORMAL LOW (ref 13.0–17.0)
Immature Granulocytes: 1 %
Lymphocytes Relative: 11 %
Lymphs Abs: 0.9 10*3/uL (ref 0.7–4.0)
MCH: 23.8 pg — ABNORMAL LOW (ref 26.0–34.0)
MCHC: 30.4 g/dL (ref 30.0–36.0)
MCV: 78.3 fL — ABNORMAL LOW (ref 80.0–100.0)
Monocytes Absolute: 0.8 10*3/uL (ref 0.1–1.0)
Monocytes Relative: 9 %
Neutro Abs: 6.4 10*3/uL (ref 1.7–7.7)
Neutrophils Relative %: 75 %
Platelets: 455 10*3/uL — ABNORMAL HIGH (ref 150–400)
RBC: 3.87 MIL/uL — ABNORMAL LOW (ref 4.22–5.81)
RDW: 13.6 % (ref 11.5–15.5)
WBC: 8.5 10*3/uL (ref 4.0–10.5)
nRBC: 0 % (ref 0.0–0.2)

## 2021-01-16 LAB — COMPREHENSIVE METABOLIC PANEL
ALT: 7 U/L (ref 0–44)
AST: 17 U/L (ref 15–41)
Albumin: 3.3 g/dL — ABNORMAL LOW (ref 3.5–5.0)
Alkaline Phosphatase: 55 U/L (ref 38–126)
Anion gap: 13 (ref 5–15)
BUN: 12 mg/dL (ref 6–20)
CO2: 26 mmol/L (ref 22–32)
Calcium: 9.9 mg/dL (ref 8.9–10.3)
Chloride: 99 mmol/L (ref 98–111)
Creatinine, Ser: 0.63 mg/dL (ref 0.61–1.24)
GFR, Estimated: >60 mL/min (ref >60)
Glucose, Bld: 110 mg/dL — ABNORMAL HIGH (ref 70–99)
Potassium: 4.3 mmol/L (ref 3.5–5.1)
Sodium: 138 mmol/L (ref 135–145)
Total Bilirubin: 0.6 mg/dL (ref 0.3–1.2)
Total Protein: 8.6 g/dL — ABNORMAL HIGH (ref 6.5–8.1)

## 2021-01-16 LAB — TSH: TSH: 1.244 u[IU]/mL (ref 0.350–4.500)

## 2021-01-16 MED ORDER — OXYCODONE HCL 5 MG PO TABS: 10.0000 mg | ORAL_TABLET | ORAL | 0 refills | Status: DC | PRN

## 2021-01-16 MED ORDER — FENTANYL 75 MCG/HR TD PT72: 1.0000 | MEDICATED_PATCH | TRANSDERMAL | 0 refills | Status: DC

## 2021-01-16 NOTE — Progress Notes (Signed)
Hematology/Oncology follow-up note [facility] Telephone:([phone] [phone]   Patient Care Team: Franklyn Lor, MD as PCP - General (Internal Medicine) Patient, No Pcp Per (General Practice)  REFERRING PROVIDER: Franklyn Lor, MD  CHIEF COMPLAINTS/REASON FOR VISIT:  Follow up for metastatic melanoma.   HISTORY OF PRESENTING ILLNESS:   Nicholas Pruitt is a  50 y.o.  male with PMH listed below was seen in consultation at the request of  Franklyn Lor, MD  for evaluation of metastatic melanoma.  Patient's oncology care was previously at [facility]. Most history was obtained from reviewing her records via careeverywhere as patient does not feel well due to the pain.  He was accompanied by his twin brother who he lives with.   1. Metastatic Melanoma, axillary mass, left A. 01/02/20 Dr. Lysle Pearl, evaluation of axillary mass, left B. 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 C. 01/24/20 Dr. Lorayne Marek (orthopedic surgery), seen in evaluation for further evaluation-need for bx D. 01/25/20: CT Chest: at least 2 large discrete oval left axillary and left supectoral masses, slightly increased in size compared to 01/03/20 MRI. Left upper lobe 5 mm groundglass pulmonary nodule is inderterminate-f/u recommended. E. 01/29/20: Dr. Lorayne Marek, Left axilla mass, biopsy: Most consistent with metastatic melanoma -diffusely positive for SOX10, focal positive for S100 and negative for pancytokeratin and TTF1 -Immunohistochemistry for Mart/HMB45 and MiTF is strong and diffusely positive, further supporting the diagnosis of metastatic melanoma. - [id number] F. 02/14/20: PET/CT:large left axillary and left subpectoral hypermetabolic mass, consistent with biopsy proven melanoma. Hypermetabolic soft tissue mass in gallbladder fundus concerning for metastatic or primary  malignancy. Metastatic right inguinal lymph nodes. Hypermetabolic left thyroid lobe nodule. Small left upper lobe ground glass nodule without FDG activity.   G. 04/03/20: Encorafenib/binimetinib recently approved with financial assistance. Pain management discussed.  H. 04/17/20 Followup visit. Taking medications incorrectly. Went over dosing with encorafenib 6 pills daily and binimetinib 3 tablets twice a day. Pain has improved. Left axillary mass has improved.  I. 06/03/20 Ct c/a/p: increased size and central necrosis of the subpectoral component of the left axillary mass, lateral axillary component unchanged, decrease in size in right inguinal adenopathy J. 07/02/20 Echo: LV ejection fraction: 50%. Enco/bini 450/36m, hold treatment for 3 days, restart at 300/30 mg. Written instructions given. K. 07/30/20 PET CT: decrease in size of axillary adenopathy, resolution of R inguinal node, resolution of R fundal gallbladder soft tissue L. 09/11/20 ECHO: Ejection fraction 44% Enco/bini 300/30 mg will restart in next few days. Change in specialty pharmacy, enco/bini on hold for last three weeks.  M. 10/16/20 followup visit. Saw XRT prior to seeing me. Started enco/bini 09/13/20, not taking consistently due to nausea/vomiting so has missed several doses. Referred to palliative, instructed to stop enco/bini as he was not deriving benefit  N. 10/21/20 CT brain- no intracranial disease CT c/a/p: Increased size and enhancement of left axillary and left subpectoral mass which is now confluent. now measuring at least 10.6 x 7.8 x 12.2 cm, previously 6.7 x 8.1 x 10.6 cm. Several new small soft tissue satellite nodules. O. 10/30/20 XRT to axilla  P. 11/27/20 follow up visit. He has completed 2 of 3 doses of radiation to left axilla.  Q. 12/16/20 completed radiation  Pain, left axially mass pain, he is on Fentanyl Patch 565m /h every 72 hours, and oxycodone 10 mg every 4 hours. Still has 7-8 out of  10 pain, radiate to his  anterior chest wall and left flank.   Nausea, currently on Zofran and compazine. He reports antiemetics are not working as he vomits the medication out. He smokes Marijuana daily.    INTERVAL HISTORY Nicholas Pruitt is a 50 y.o. male who has above history reviewed by me today presents for follow up visit for management of metastatic melanoma. Problems and complaints are listed below: Patient was accompanied by her mom. Patient reports intractable pain around left axillary mass. 9/10  Is currently on fentanyl patch 50 MCG, takes oxycodone 2 to 4 tablets every 4 hours and he reports "pain medication is not touching it". Nausea and 1 episode of vomiting this morning.  Zofran ODT provides some symptom relief  Review of Systems  Constitutional: Positive for appetite change, fatigue and unexpected weight change. Negative for chills and fever.  HENT:   Negative for hearing loss and voice change.   Eyes: Negative for eye problems and icterus.  Respiratory: Negative for chest tightness, cough and shortness of breath.   Cardiovascular: Negative for chest pain and leg swelling.  Gastrointestinal: Positive for nausea and vomiting. Negative for abdominal distention and abdominal pain.  Endocrine: Negative for hot flashes.  Genitourinary: Negative for difficulty urinating, dysuria and frequency.   Musculoskeletal: Negative for arthralgias.       Left axillary mass pain  Skin: Negative for itching and rash.  Neurological: Negative for light-headedness and numbness.  Hematological: Negative for adenopathy. Does not bruise/bleed easily.  Psychiatric/Behavioral: Negative for confusion.    MEDICAL HISTORY:  Past Medical History:  Diagnosis Date  . Metastatic melanoma ([place])     SURGICAL HISTORY: History reviewed. No pertinent surgical history.  SOCIAL HISTORY: Social History   Socioeconomic History  . Marital status: Widowed    Spouse name: Not on file  . Number of children: Not on file  .  Years of education: Not on file  . Highest education level: Not on file  Occupational History  . Occupation: disabled  Tobacco Use  . Smoking status: Current Every Day Smoker    Packs/day: 1.50    Years: 20.00    Pack years: 30.00    Types: Cigarettes  . Smokeless tobacco: Never Used  Vaping Use  . Vaping Use: Never used  Substance and Sexual Activity  . Alcohol use: Not Currently  . Drug use: Yes    Types: Marijuana    Comment: smokes marijuana daily  . Sexual activity: Not on file  Other Topics Concern  . Not on file  Social History Narrative   ** Merged History Encounter **       Social Determinants of Health   Financial Resource Strain: Not on file  Food Insecurity: Not on file  Transportation Needs: Not on file  Physical Activity: Not on file  Stress: Not on file  Social Connections: Not on file  Intimate Partner Violence: Not on file    FAMILY HISTORY: Family History  Problem Relation Age of Onset  . Breast cancer Mother   . Kidney cancer Mother     ALLERGIES:  has No Known Allergies.  MEDICATIONS:  Current Outpatient Medications  Medication Sig Dispense Refill  . Docusate Sodium (DSS) 100 MG CAPS Take 100 mg by mouth in the morning and at bedtime.    . fentaNYL (DURAGESIC) 75 MCG/HR Place 1 patch onto the skin every 3 (three) days. 5 patch 0  . gabapentin (NEURONTIN) 100 MG capsule Take 100 mg by mouth in the  morning, at noon, and at bedtime.    . ondansetron (ZOFRAN ODT) 8 MG disintegrating tablet Take 1 tablet (8 mg total) by mouth every 8 (eight) hours as needed for nausea or vomiting. 90 tablet 0  . potassium chloride (KLOR-CON) 10 MEQ tablet Take 10 mEq by mouth 2 (two) times daily.    . prochlorperazine (COMPAZINE) 10 MG tablet Take by mouth. Take 1 tablet (10 mg total) by mouth every 6 (six) hours as needed for Nausea    . naloxone (NARCAN) nasal spray 4 mg/0.1 mL Place into the nose. Place 1 spray (4 mg total) into one nostril once as needed (if  not breathing or overdose is suspected.) for up to 1 dose Give 2nd dose in 5-10 min if not responding or if sx return for up to 1 dose (Patient not taking: Reported on 01/16/2021)    . oxyCODONE (OXY IR/ROXICODONE) 5 MG immediate release tablet Take 2 tablets (10 mg total) by mouth every 4 (four) hours as needed for severe pain. Take 1-2 tablets (5-10 mg total) by mouth every 4 (four) hours as needed (cancer related pain 60 tablet 0   No current facility-administered medications for this visit.     PHYSICAL EXAMINATION: ECOG PERFORMANCE STATUS: 1 - Symptomatic but completely ambulatory Vitals:   01/16/21 0922  BP: 114/77  Pulse: (!) 106  Resp: 18  Temp: 98 F (36.7 C)   Filed Weights   01/16/21 0922  Weight: 89 lb 9.6 oz (40.6 kg)    Physical Exam Constitutional:      Comments: Some distress due to the pain.  HENT:     Head: Normocephalic.  Eyes:     General: No scleral icterus. Cardiovascular:     Rate and Rhythm: Normal rate and regular rhythm.     Heart sounds: Normal heart sounds.  Pulmonary:     Effort: Pulmonary effort is normal. No respiratory distress.     Breath sounds: No wheezing.  Abdominal:     General: Bowel sounds are normal. There is no distension.     Palpations: Abdomen is soft.  Musculoskeletal:        General: No deformity. Normal range of motion.     Cervical back: Normal range of motion and neck supple.     Comments: Left axillary mass with focal erythema. Tender with palpation.   Skin:    General: Skin is warm and dry.  Neurological:     Mental Status: He is alert and oriented to person, place, and time. Mental status is at baseline.     Cranial Nerves: No cranial nerve deficit.     Coordination: Coordination normal.       LABORATORY DATA:  I have reviewed the data as listed Lab Results  Component Value Date   WBC 8.5 01/16/2021   HGB 9.2 (L) 01/16/2021   HCT 30.3 (L) 01/16/2021   MCV 78.3 (L) 01/16/2021   PLT 455 (H) 01/16/2021    Recent Labs    01/08/21 1627 01/16/21 0855  NA 137 138  K 3.8 4.3  CL 97* 99  CO2 26 26  GLUCOSE 79 110*  BUN 9 12  CREATININE 0.50* 0.63  CALCIUM 9.6 9.9  GFRNONAA >60 >60  PROT 8.3* 8.6*  ALBUMIN 3.3* 3.3*  AST 17 17  ALT 5 7  ALKPHOS 56 55  BILITOT 0.4 0.6   Iron/TIBC/Ferritin/ %Sat No results found for: IRON, TIBC, FERRITIN, IRONPCTSAT    RADIOGRAPHIC STUDIES: I have personally reviewed the  radiological images as listed and agreed with the findings in the report. No results found.  I reviewed his previous CT and PET scans that were done at [facility]. Images were not available.  ASSESSMENT & PLAN:  1. Metastatic melanoma ([place])   2. Goals of care, counseling/discussion   3. Non-intractable vomiting with nausea, unspecified vomiting type   4. Weight loss   Cancer Staging Metastatic melanoma [facility]) Staging form: Melanoma of the Skin, AJCC 8th Edition - Clinical stage from 01/08/2021: Stage IV (ycTX, cNX, cM1) - Signed by Earlie Server, MD on 01/08/2021   # Stage IV metastatic melanoma, BRAFV600E Labs are reviewed and discussed with patient. Ok to proceed Nivolumab and Ipilimumab. Still awaiting insurance approval.   # Chemotherapy education # Nausea/vomiting continue Zofran ODT. I recommend IV antiemetics and IV hydration and he declines.   # Neoplasm related pain.  Recommend him to continue Oxycodone 10mg  Q4 hours as needed.  Increase fentanyl patch to 44mcg/h every 72 hours. Refills sent to pharmacy.  Recommend him to increase to 200mg  TID and can further titrate up if he tolerates.he reports that he have enough supply and does not want a new Rx.  Recommend NSAID and he is not interested.  # Weight loss, recommend nutrition supplement.  # refer to establish care with palliative care service.  # Refer to Radonc.  Supportive care measures are necessary for patient well-being and will be provided as necessary. We spent sufficient time to discuss many aspect of care,  questions were answered to patient's satisfaction.   Orders Placed This Encounter  Procedures  . Ambulatory referral to Radiation Oncology    Referral Priority:   Routine    Referral Type:   Consultation    Referral Reason:   Specialty Services Required    Requested Specialty:   Radiation Oncology    Number of Visits Requested:   1  . Ambulatory Referral to Palliative Care    Referral Priority:   Routine    Referral Type:   Consultation    Referral Reason:   Symptom Managment    Referred to Provider:   Borders, Kirt Boys, NP    Number of Visits Requested:   1    All questions were answered. The patient knows to call the clinic with any problems questions or concerns.  Earlie Server, MD, PhD Hematology Oncology [facility] at [facility] Pager- [phone] 01/16/2021

## 2021-01-16 NOTE — Progress Notes (Signed)
Patient here for follow up. Pt vomiting this morning, provided him with ginger ale and he states that nausea has lessened. Has not taken pain med this morning because he is afraid he will throw it up.

## 2021-01-16 NOTE — Telephone Encounter (Signed)
Prior auth for Fentanyl patch 75 mcg submitted via Cover My Meds Key: [id number] PA Case ID: [id number]

## 2021-01-17 LAB — T4: T4, Total: 8.5 ug/dL (ref 4.5–12.0)

## 2021-01-17 NOTE — Telephone Encounter (Signed)
Additional information submitted to [employer] [phone]) via received form with additional information

## 2021-01-20 ENCOUNTER — Inpatient Hospital Stay: Payer: 59

## 2021-01-20 ENCOUNTER — Inpatient Hospital Stay: Payer: 59 | Admitting: Hospice and Palliative Medicine

## 2021-01-20 NOTE — Telephone Encounter (Signed)
Approval notification received by fax (scanned in media).

## 2021-01-22 ENCOUNTER — Encounter: Payer: Self-pay | Admitting: Radiation Oncology

## 2021-01-23 ENCOUNTER — Encounter: Payer: Self-pay | Admitting: Radiation Oncology

## 2021-01-23 ENCOUNTER — Other Ambulatory Visit: Payer: Self-pay | Admitting: Licensed Clinical Social Worker

## 2021-01-23 ENCOUNTER — Ambulatory Visit
Admission: RE | Admit: 2021-01-23 | Discharge: 2021-01-23 | Disposition: A | Discharge status: Home / Self Care | Payer: 59 | Source: Ambulatory Visit | Attending: Radiation Oncology | Admitting: Radiation Oncology

## 2021-01-23 ENCOUNTER — Ambulatory Visit
Admission: RE | Admit: 2021-01-23 | Discharge: 2021-01-23 | Disposition: A | Discharge status: Home / Self Care | Payer: Self-pay | Source: Ambulatory Visit | Attending: Radiation Oncology | Admitting: Radiation Oncology

## 2021-01-23 ENCOUNTER — Other Ambulatory Visit: Payer: Self-pay | Admitting: *Deleted

## 2021-01-23 VITALS — BP 121/81 | HR 109 | Temp 97.7°F | Wt 88.6 lb

## 2021-01-23 DIAGNOSIS — C439 Malignant melanoma of skin, unspecified: Secondary | ICD-10-CM

## 2021-01-23 DIAGNOSIS — C799 Secondary malignant neoplasm of unspecified site: Secondary | ICD-10-CM

## 2021-01-23 NOTE — Addendum Note (Signed)
Addended by: Jarrett Ables D on: 01/23/2021 11:21 AM   Modules accepted: Orders

## 2021-01-23 NOTE — Consult Note (Signed)
NEW PATIENT EVALUATION  Name: Nicholas Pruitt  MRN: [id number]  Date:   01/23/2021     DOB: [birth year]   This 50 y.o. male patient presents to the clinic for initial evaluation of consideration of palliative radiation therapy to large left axillary mass biopsy positive for metastatic melanoma.  REFERRING PHYSICIAN: Franklyn Lor, MD  CHIEF COMPLAINT:  Chief Complaint  Patient presents with  . metastic melanoma    DIAGNOSIS: The encounter diagnosis was Metastatic melanoma ([place]).   PREVIOUS INVESTIGATIONS:  MRI scans reviewed PET CT scan requested for my review Pathology report reviewed Clinical notes reviewed  HPI: Patient is a 50 year old male who presented back in early 2021 with a large left axillary mass.  MRI scan demonstrated a 12 x 10 x 5 center mass left axilla deep to the para pectoralis muscle and deep to the subscapularis muscle.  Biopsy was positive for metastatic melanoma with immunohistochemical stains supporting that diagnosis.  Patient had a PET CT scan which I have reviewed the reports and have requested for my personal review.  Patient was started onencorafenib which has been titrated based on reduced ejection fraction.  In November he received radiation therapy at [facility] 2400 cGy in 3 fractions.  He is currently on narcotic analgesics fentanyl patch and oxycodone although still having significant left axillary pain.  He has since transferred care to Dr. Tasia Catchings medical oncology who is planning toproceed Nivolumab and Ipilimumab.  He is now referred to radiation oncology for possible further palliative treatment to the large left axillary mass causing significant pressure on his chest as well as significant pain.  PLANNED TREATMENT REGIMEN: Palliative radiation therapy to left axillary mass  PAST MEDICAL HISTORY:  has a past medical history of Metastatic melanoma ([place]).    PAST SURGICAL HISTORY: History reviewed. No pertinent surgical history.  FAMILY HISTORY: family  history includes Breast cancer in his mother; Kidney cancer in his mother.  SOCIAL HISTORY:  reports that he has been smoking cigarettes. He has a 30.00 pack-year smoking history. He has never used smokeless tobacco. He reports previous alcohol use. He reports current drug use. Drug: Marijuana.  ALLERGIES: Patient has no known allergies.  MEDICATIONS:  Current Outpatient Medications  Medication Sig Dispense Refill  . Docusate Sodium (DSS) 100 MG CAPS Take 100 mg by mouth in the morning and at bedtime.    . fentaNYL (DURAGESIC) 75 MCG/HR Place 1 patch onto the skin every 3 (three) days. 5 patch 0  . gabapentin (NEURONTIN) 100 MG capsule Take 100 mg by mouth in the morning, at noon, and at bedtime.    . naloxone (NARCAN) nasal spray 4 mg/0.1 mL Place into the nose. Place 1 spray (4 mg total) into one nostril once as needed (if not breathing or overdose is suspected.) for up to 1 dose Give 2nd dose in 5-10 min if not responding or if sx return for up to 1 dose    . ondansetron (ZOFRAN ODT) 8 MG disintegrating tablet Take 1 tablet (8 mg total) by mouth every 8 (eight) hours as needed for nausea or vomiting. 90 tablet 0  . oxyCODONE (OXY IR/ROXICODONE) 5 MG immediate release tablet Take 2 tablets (10 mg total) by mouth every 4 (four) hours as needed for severe pain. Take 1-2 tablets (5-10 mg total) by mouth every 4 (four) hours as needed (cancer related pain 60 tablet 0  . potassium chloride (KLOR-CON) 10 MEQ tablet Take 10 mEq by mouth 2 (two) times daily.    Marland Kitchen  prochlorperazine (COMPAZINE) 10 MG tablet Take by mouth. Take 1 tablet (10 mg total) by mouth every 6 (six) hours as needed for Nausea     No current facility-administered medications for this encounter.    ECOG PERFORMANCE STATUS:  1 - Symptomatic but completely ambulatory  REVIEW OF SYSTEMS: Patient denies any weight loss, fatigue, weakness, fever, chills or night sweats. Patient denies any loss of vision, blurred vision. Patient denies  any ringing  of the ears or hearing loss. No irregular heartbeat. Patient denies heart murmur or history of fainting. Patient denies any chest pain or pain radiating to her upper extremities. Patient denies any shortness of breath, difficulty breathing at night, cough or hemoptysis. Patient denies any swelling in the lower legs. Patient denies any nausea vomiting, vomiting of blood, or coffee ground material in the vomitus. Patient denies any stomach pain. Patient states has had normal bowel movements no significant constipation or diarrhea. Patient denies any dysuria, hematuria or significant nocturia. Patient denies any problems walking, swelling in the joints or loss of balance. Patient denies any skin changes, loss of hair or loss of weight. Patient denies any excessive worrying or anxiety or significant depression. Patient denies any problems with insomnia. Patient denies excessive thirst, polyuria, polydipsia. Patient denies any swollen glands, patient denies easy bruising or easy bleeding. Patient denies any recent infections, allergies or URI. Patient "s visual fields have not changed significantly in recent time.   PHYSICAL EXAM: BP 121/81   Pulse (!) 109   Temp 97.7 F (36.5 C) (Tympanic)   Wt 88 lb 9.6 oz (40.2 kg)  Patient has large extensive mass in the left axilla and affixed to the chest wall.  Wheelchair-bound frail.  Well-developed well-nourished patient in NAD. HEENT reveals PERLA, EOMI, discs not visualized.  Oral cavity is clear. No oral mucosal lesions are identified. Neck is clear without evidence of cervical or supraclavicular adenopathy. Lungs are clear to A&P. Cardiac examination is essentially unremarkable with regular rate and rhythm without murmur rub or thrill. Abdomen is benign with no organomegaly or masses noted. Motor sensory and DTR levels are equal and symmetric in the upper and lower extremities. Cranial nerves II through XII are grossly intact. Proprioception is intact.  No peripheral adenopathy or edema is identified. No motor or sensory levels are noted. Crude visual fields are within normal range.  LABORATORY DATA: Pathology report reviewed    RADIOLOGY RESULTS: MRI scan reviewed PET CT scan requested for my review   IMPRESSION: Metastatic melanoma and left axillary mass causing significant narcotic dependent pain in 50 year old male  PLAN: This time I believe I can deliver another 54 Gray in 10 fractions hopefully to achieve some palliation of pain.  Risks and benefits of treatment occluding skin reaction fatigue possible further necrosis secondary to previous radiation treatments all were discussed with the patient and his twin brother they both seem to comprehend my recommendations well.  I have set him up for CT simulation early next week and will start treatments as soon as possible.  Patient has also had establish care with palliative care at this time.  Patient and brother both seem to comprehend my recommendations well.  I would like to take this opportunity to thank you for allowing me to participate in the care of your patient.Noreene Filbert, MD

## 2021-01-24 ENCOUNTER — Telehealth: Payer: Self-pay | Admitting: Nurse Practitioner

## 2021-01-24 NOTE — Telephone Encounter (Signed)
Spoke with patient to see if we could reschedule the 01/27/21 Palliative f/u visit and he was fine with this, he stated that he had another appointment that day and needed to reschedule.  Palliative f/u visit rescheduled for 02/20/21 @ 10:30 AM.

## 2021-01-27 ENCOUNTER — Inpatient Hospital Stay (HOSPITAL_BASED_OUTPATIENT_CLINIC_OR_DEPARTMENT_OTHER): Payer: 59 | Admitting: Hospice and Palliative Medicine

## 2021-01-27 ENCOUNTER — Ambulatory Visit
Admission: RE | Admit: 2021-01-27 | Discharge: 2021-01-27 | Disposition: A | Discharge status: Home / Self Care | Payer: 59 | Source: Ambulatory Visit | Attending: Radiation Oncology | Admitting: Radiation Oncology

## 2021-01-27 ENCOUNTER — Telehealth: Payer: Self-pay

## 2021-01-27 DIAGNOSIS — Z510 Encounter for antineoplastic radiation therapy: Secondary | ICD-10-CM | POA: Diagnosis not present

## 2021-01-27 DIAGNOSIS — C799 Secondary malignant neoplasm of unspecified site: Secondary | ICD-10-CM

## 2021-01-27 DIAGNOSIS — G893 Neoplasm related pain (acute) (chronic): Secondary | ICD-10-CM | POA: Diagnosis not present

## 2021-01-27 DIAGNOSIS — Z515 Encounter for palliative care: Secondary | ICD-10-CM

## 2021-01-27 DIAGNOSIS — C439 Malignant melanoma of skin, unspecified: Secondary | ICD-10-CM

## 2021-01-27 MED ORDER — OXYCODONE HCL ER 20 MG PO T12A: 20.0000 mg | EXTENDED_RELEASE_TABLET | Freq: Two times a day (BID) | ORAL | 0 refills | Status: AC

## 2021-01-27 MED ORDER — OXYCODONE HCL 10 MG PO TABS: 10.0000 mg | ORAL_TABLET | ORAL | 0 refills | Status: AC | PRN

## 2021-01-27 NOTE — Telephone Encounter (Signed)
PA request for Oxycontin ER 20mg  prescribed by Pete Glatter, NP submitted on cover my meds (Key: [id number] - PA Case ID: [id number])

## 2021-01-27 NOTE — Telephone Encounter (Signed)
Additional information received and faxed back to [employer].

## 2021-01-27 NOTE — Progress Notes (Signed)
[place]  [phone] [phone]   Name: Nicholas Pruitt Date: 01/27/2021 MRN: [id number]  DOB: 19-Mar-1971  Patient Care Team: Franklyn Lor, MD as PCP - General (Internal Medicine) Patient, No Pcp Per (General Practice)    REASON FOR CONSULTATION: Nicholas Pruitt is a 50 y.o. male with multiple medical problems including metastatic melanoma extensively involving the left axilla status post XRT and chemo currently on Nivolumab + Ipilimumab.  Patient has had intractable pain.  Patient was previously followed by home-based palliative care.  He was seen by [facility] interventional pain clinic for consideration of intercostal nerve block but ultimately canceled follow-up.  He is now referred to palliative care to help address goals and manage ongoing symptoms.    SOCIAL HISTORY:     reports that he has been smoking cigarettes. He has a 30.00 pack-year smoking history. He has never used smokeless tobacco. He reports previous alcohol use. He reports current drug use. Drug: Marijuana.  Patient is a widower.  He lives at home with his twin brother. He has two adult children.  Patient formally worked in a [employer].  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  ADVANCE DIRECTIVES:  Not on file  CODE STATUS:   PAST MEDICAL HISTORY: Past Medical History:  Diagnosis Date  . Metastatic melanoma ([place])     PAST SURGICAL HISTORY: No past surgical history on file.  HEMATOLOGY/ONCOLOGY HISTORY:  Oncology History  Metastatic melanoma ([place])  01/08/2021 Initial Diagnosis   Metastatic melanoma ([place])   01/08/2021 Cancer Staging   Staging form: Melanoma of the Skin, AJCC 8th Edition - Clinical stage from 01/08/2021: Stage IV (ycTX, cNX, cM1) - Signed by Earlie Server, MD on 01/08/2021   01/16/2021 -  Chemotherapy    Patient is on Treatment Plan: MELANOMA NIVOLUMAB + IPILIMUMAB (1/3) Q21D /  NIVOLUMAB Q14D        ALLERGIES:  has No Known Allergies.  MEDICATIONS:  Current Outpatient Medications  Medication Sig Dispense Refill  . Docusate Sodium (DSS) 100 MG CAPS Take 100 mg by mouth in the morning and at bedtime.    . fentaNYL (DURAGESIC) 75 MCG/HR Place 1 patch onto the skin every 3 (three) days. 5 patch 0  . gabapentin (NEURONTIN) 100 MG capsule Take 100 mg by mouth in the morning, at noon, and at bedtime.    . naloxone (NARCAN) nasal spray 4 mg/0.1 mL Place into the nose. Place 1 spray (4 mg total) into one nostril once as needed (if not breathing or overdose is suspected.) for up to 1 dose Give 2nd dose in 5-10 min if not responding or if sx return for up to 1 dose    . ondansetron (ZOFRAN ODT) 8 MG disintegrating tablet Take 1 tablet (8 mg total) by mouth every 8 (eight) hours as needed for nausea or vomiting. 90 tablet 0  . oxyCODONE (OXY IR/ROXICODONE) 5 MG immediate release tablet Take 2 tablets (10 mg total) by mouth every 4 (four) hours as needed for severe pain. Take 1-2 tablets (5-10 mg total) by mouth every 4 (four) hours as needed (cancer related pain 60 tablet 0  . potassium chloride (KLOR-CON) 10 MEQ tablet Take 10 mEq by mouth 2 (two) times daily.    . prochlorperazine (COMPAZINE) 10 MG tablet Take by mouth. Take 1 tablet (10 mg total) by mouth every 6 (six) hours as needed for Nausea  No current facility-administered medications for this visit.    VITAL SIGNS: There were no vitals taken for this visit. There were no vitals filed for this visit.  There is no height or weight on file to calculate BMI.  LABS: CBC:    Component Value Date/Time   WBC 8.5 01/16/2021 0855   HGB 9.2 (L) 01/16/2021 0855   HCT 30.3 (L) 01/16/2021 0855   PLT 455 (H) 01/16/2021 0855   MCV 78.3 (L) 01/16/2021 0855   NEUTROABS 6.4 01/16/2021 0855   LYMPHSABS 0.9 01/16/2021 0855   MONOABS 0.8 01/16/2021 0855   EOSABS 0.3 01/16/2021 0855   BASOSABS 0.0 01/16/2021 0855    Comprehensive Metabolic Panel:    Component Value Date/Time   NA 138 01/16/2021 0855   K 4.3 01/16/2021 0855   CL 99 01/16/2021 0855   CO2 26 01/16/2021 0855   BUN 12 01/16/2021 0855   CREATININE 0.63 01/16/2021 0855   GLUCOSE 110 (H) 01/16/2021 0855   CALCIUM 9.9 01/16/2021 0855   AST 17 01/16/2021 0855   ALT 7 01/16/2021 0855   ALKPHOS 55 01/16/2021 0855   BILITOT 0.6 01/16/2021 0855   PROT 8.6 (H) 01/16/2021 0855   ALBUMIN 3.3 (L) 01/16/2021 0855    RADIOGRAPHIC STUDIES: No results found.  PERFORMANCE STATUS (ECOG) : 3 - Symptomatic, >50% confined to bed  Review of Systems Unless otherwise noted, a complete review of systems is negative.  Physical Exam General: NAD, frail, thin  Pulmonary: Unlabored Extremities: no edema, no joint deformities Skin: no rashes Neurological: Weakness but otherwise nonfocal  IMPRESSION: I met with patient and brother today in the clinic.  He was an add-on to my clinic schedule today for evaluation management of pain.  Patient endorses severe and persistent generalized pain in the axilla, back, buttocks and chest.  Pain characterizes the pain as dull, aching, and constant.  At its most severe, patient rates pain as 7-8 out of 10.  Pain level dropped to 0-1 out of 10 after he takes oxycodone.  However, the oxycodone is short-lived and patient is having to take two 108m oxycodone tablets every 3 hours around-the-clock.  Patient says that he is getting very little sleep at night due to pain.  Patient is also on transdermal fentanyl, which was recently increased from 50 mcg to 75 mcg.  Patient says that he felt the transdermal fentanyl helped when it was initially started but he is found that it is no longer effective.  Note that patient has had significant weight loss with loss of subcutaneous tissue.  His most recent weight was 88 pounds.  I fear that patient is no longer adequately absorbing the transdermal fentanyl given his weight loss and  malnourished state.  I discussed with patient the option of rotating from transdermal fentanyl to long-acting oxycodone.  He was in agreement.  We will trial OxyContin 20 mg every 12 hours with plan to increase to every 8 hour dosing if needed.  Will increase oxycodone IR to 10 mg tablets to lessen pill burden.  We discussed constipation management in detail today.  Patient reports that he was taking a stool softener every 4 hours around-the-clock.  Instead, I suggested that he start senna once or twice daily.  He may add MiraLAX daily if needed.  PLAN: -Continue current scope of treatment -DC fentanyl -Start OxyContin 20 mg every 12 hours with plan to titrate to every 8 hours if needed (#30) -Increase oxycodone IR 10 mg every 3 to 4  hours as needed for breakthrough pain (#60) -Continue gabapentin 100 mg 3 times daily and titrate as needed -Daily bowel regimen with senna/MiraLAX -Consider referral to [facility] pain clinic for consideration of nerve block. -Referral to nutrition for weight loss -RTC 2 weeks   Case and plan discussed with Dr. Tasia Catchings    Patient expressed understanding and was in agreement with this plan. He also understands that He can call the clinic at any time with any questions, concerns, or complaints.     Time Total: 30 minutes  Visit consisted of counseling and education dealing with the complex and emotionally intense issues of symptom management and palliative care in the setting of serious and potentially life-threatening illness.Greater than 50%  of this time was spent counseling and coordinating care related to the above assessment and plan.  Signed by: Altha Harm, PhD, NP-C

## 2021-01-28 NOTE — Telephone Encounter (Signed)
Oxycontin 20 mg has been approved effective 01/28/21 to 01/27/22

## 2021-01-29 ENCOUNTER — Inpatient Hospital Stay: Payer: 59

## 2021-01-29 ENCOUNTER — Encounter: Payer: Self-pay | Admitting: Oncology

## 2021-01-29 ENCOUNTER — Inpatient Hospital Stay (HOSPITAL_BASED_OUTPATIENT_CLINIC_OR_DEPARTMENT_OTHER): Payer: 59 | Admitting: Hospice and Palliative Medicine

## 2021-01-29 ENCOUNTER — Inpatient Hospital Stay (HOSPITAL_BASED_OUTPATIENT_CLINIC_OR_DEPARTMENT_OTHER): Payer: 59 | Admitting: Oncology

## 2021-01-29 ENCOUNTER — Other Ambulatory Visit: Payer: Self-pay

## 2021-01-29 VITALS — BP 113/78 | HR 122 | Temp 94.2°F | Resp 18 | Wt 86.1 lb

## 2021-01-29 DIAGNOSIS — C799 Secondary malignant neoplasm of unspecified site: Secondary | ICD-10-CM | POA: Diagnosis not present

## 2021-01-29 DIAGNOSIS — C439 Malignant melanoma of skin, unspecified: Secondary | ICD-10-CM

## 2021-01-29 DIAGNOSIS — G893 Neoplasm related pain (acute) (chronic): Secondary | ICD-10-CM

## 2021-01-29 DIAGNOSIS — Z515 Encounter for palliative care: Secondary | ICD-10-CM | POA: Diagnosis not present

## 2021-01-29 DIAGNOSIS — R112 Nausea with vomiting, unspecified: Secondary | ICD-10-CM | POA: Diagnosis not present

## 2021-01-29 DIAGNOSIS — Z7189 Other specified counseling: Secondary | ICD-10-CM

## 2021-01-29 DIAGNOSIS — Z510 Encounter for antineoplastic radiation therapy: Secondary | ICD-10-CM | POA: Diagnosis not present

## 2021-01-29 LAB — CBC WITH DIFFERENTIAL/PLATELET
Abs Immature Granulocytes: 0.43 10*3/uL — ABNORMAL HIGH (ref 0.00–0.07)
Basophils Absolute: 0 10*3/uL (ref 0.0–0.1)
Basophils Relative: 0 %
Eosinophils Absolute: 0 10*3/uL (ref 0.0–0.5)
Eosinophils Relative: 0 %
HCT: 29.2 % — ABNORMAL LOW (ref 39.0–52.0)
Hemoglobin: 9.2 g/dL — ABNORMAL LOW (ref 13.0–17.0)
Immature Granulocytes: 3 %
Lymphocytes Relative: 6 %
Lymphs Abs: 0.8 10*3/uL (ref 0.7–4.0)
MCH: 23.7 pg — ABNORMAL LOW (ref 26.0–34.0)
MCHC: 31.5 g/dL (ref 30.0–36.0)
MCV: 75.3 fL — ABNORMAL LOW (ref 80.0–100.0)
Monocytes Absolute: 1.3 10*3/uL — ABNORMAL HIGH (ref 0.1–1.0)
Monocytes Relative: 9 %
Neutro Abs: 11.9 10*3/uL — ABNORMAL HIGH (ref 1.7–7.7)
Neutrophils Relative %: 82 %
Platelets: 219 10*3/uL (ref 150–400)
RBC: 3.88 MIL/uL — ABNORMAL LOW (ref 4.22–5.81)
RDW: 15.1 % (ref 11.5–15.5)
WBC: 14.5 10*3/uL — ABNORMAL HIGH (ref 4.0–10.5)
nRBC: 0 % (ref 0.0–0.2)

## 2021-01-29 LAB — COMPREHENSIVE METABOLIC PANEL
ALT: 11 U/L (ref 0–44)
AST: 39 U/L (ref 15–41)
Albumin: 2.9 g/dL — ABNORMAL LOW (ref 3.5–5.0)
Alkaline Phosphatase: 111 U/L (ref 38–126)
Anion gap: 16 — ABNORMAL HIGH (ref 5–15)
BUN: 17 mg/dL (ref 6–20)
CO2: 27 mmol/L (ref 22–32)
Calcium: 10.9 mg/dL — ABNORMAL HIGH (ref 8.9–10.3)
Chloride: 90 mmol/L — ABNORMAL LOW (ref 98–111)
Creatinine, Ser: 0.66 mg/dL (ref 0.61–1.24)
GFR, Estimated: >60 mL/min (ref >60)
Glucose, Bld: 121 mg/dL — ABNORMAL HIGH (ref 70–99)
Potassium: 4.2 mmol/L (ref 3.5–5.1)
Sodium: 133 mmol/L — ABNORMAL LOW (ref 135–145)
Total Bilirubin: 1.1 mg/dL (ref 0.3–1.2)
Total Protein: 8.2 g/dL — ABNORMAL HIGH (ref 6.5–8.1)

## 2021-01-29 LAB — LACTATE DEHYDROGENASE: LDH: 1877 U/L — ABNORMAL HIGH (ref 98–192)

## 2021-01-29 MED ORDER — SODIUM CHLORIDE 0.9 % IV SOLN
INTRAVENOUS | Status: DC
  Filled 2021-01-29 (×2): qty 250

## 2021-01-29 MED ORDER — SODIUM CHLORIDE 0.9 % IV SOLN
40.0000 mg | Freq: Once | INTRAVENOUS | Status: AC
  Administered 2021-01-29: 40 mg via INTRAVENOUS
  Filled 2021-01-29: qty 4

## 2021-01-29 MED ORDER — FAMOTIDINE IN NACL 20-0.9 MG/50ML-% IV SOLN: 20.0000 mg | Freq: Once | INTRAVENOUS | Status: DC

## 2021-01-29 MED ORDER — SODIUM CHLORIDE 0.9 % IV SOLN
INTRAVENOUS | Status: DC
  Filled 2021-01-29: qty 250

## 2021-01-29 MED ORDER — ONDANSETRON HCL 4 MG/2ML IJ SOLN
8.0000 mg | Freq: Once | INTRAMUSCULAR | Status: AC
  Administered 2021-01-29: 8 mg via INTRAVENOUS
  Filled 2021-01-29: qty 4

## 2021-01-29 MED ORDER — SODIUM CHLORIDE 0.9 % IV SOLN: Freq: Once | INTRAVENOUS | Status: DC

## 2021-01-29 MED ORDER — DIPHENHYDRAMINE HCL 50 MG/ML IJ SOLN: 25.0000 mg | Freq: Once | INTRAMUSCULAR | Status: DC

## 2021-01-29 NOTE — Progress Notes (Signed)
Patient here for oncology follow-up appointment, expresses concerns of back, arm and stomach pain with constipation

## 2021-01-29 NOTE — Progress Notes (Signed)
Hematology/Oncology follow-up note [facility] [phone] [phone]   Patient Care Team: Franklyn Lor, MD as PCP - General (Internal Medicine) Patient, No Pcp Per (General Practice)  REFERRING PROVIDER: Franklyn Lor, MD  CHIEF COMPLAINTS/REASON FOR VISIT:  Follow up for metastatic melanoma.   HISTORY OF PRESENTING ILLNESS:   Nicholas Pruitt is a  50 y.o.  male with PMH listed below was seen in consultation at the request of  Franklyn Lor, MD  for evaluation of metastatic melanoma.  Patient's oncology care was previously at [facility]. Most history was obtained from reviewing her records via careeverywhere as patient does not feel well due to the pain.  He was accompanied by his twin brother who he lives with.   1. Metastatic Melanoma, axillary mass, left A. 01/02/20 Dr. Lysle Pearl, evaluation of axillary mass, left B. 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 C. 01/24/20 Dr. Lorayne Marek (orthopedic surgery), seen in evaluation for further evaluation-need for bx D. 01/25/20: CT Chest: at least 2 large discrete oval left axillary and left supectoral masses, slightly increased in size compared to 01/03/20 MRI. Left upper lobe 5 mm groundglass pulmonary nodule is inderterminate-f/u recommended. E. 01/29/20: Dr. Lorayne Marek, Left axilla mass, biopsy: Most consistent with metastatic melanoma -diffusely positive for SOX10, focal positive for S100 and negative for pancytokeratin and TTF1 -Immunohistochemistry for Mart/HMB45 and MiTF is strong and diffusely positive, further supporting the diagnosis of metastatic melanoma. - [id number] F. 02/14/20: PET/CT:large left axillary and left subpectoral hypermetabolic mass, consistent with biopsy proven melanoma. Hypermetabolic soft tissue mass in gallbladder fundus concerning for metastatic or primary  malignancy. Metastatic right inguinal lymph nodes. Hypermetabolic left thyroid lobe nodule. Small left upper lobe ground glass nodule without FDG activity.   G. 04/03/20: Encorafenib/binimetinib recently approved with financial assistance. Pain management discussed.  H. 04/17/20 Followup visit. Taking medications incorrectly. Went over dosing with encorafenib 6 pills daily and binimetinib 3 tablets twice a day. Pain has improved. Left axillary mass has improved.  I. 06/03/20 Ct c/a/p: increased size and central necrosis of the subpectoral component of the left axillary mass, lateral axillary component unchanged, decrease in size in right inguinal adenopathy J. 07/02/20 Echo: LV ejection fraction: 50%. Enco/bini 450/36m, hold treatment for 3 days, restart at 300/30 mg. Written instructions given. K. 07/30/20 PET CT: decrease in size of axillary adenopathy, resolution of R inguinal node, resolution of R fundal gallbladder soft tissue L. 09/11/20 ECHO: Ejection fraction 44% Enco/bini 300/30 mg will restart in next few days. Change in specialty pharmacy, enco/bini on hold for last three weeks.  M. 10/16/20 followup visit. Saw XRT prior to seeing me. Started enco/bini 09/13/20, not taking consistently due to nausea/vomiting so has missed several doses. Referred to palliative, instructed to stop enco/bini as he was not deriving benefit  N. 10/21/20 CT brain- no intracranial disease CT c/a/p: Increased size and enhancement of left axillary and left subpectoral mass which is now confluent. now measuring at least 10.6 x 7.8 x 12.2 cm, previously 6.7 x 8.1 x 10.6 cm. Several new small soft tissue satellite nodules. O. 10/30/20 XRT to axilla  P. 11/27/20 follow up visit. He has completed 2 of 3 doses of radiation to left axilla.  Q. 12/16/20 completed radiation  Pain, left axially mass pain, he is on Fentanyl Patch 565m /h every 72 hours, and oxycodone 10 mg every 4 hours. Still has 7-8 out of  10 pain, radiate to his  anterior chest wall and left flank.   Nausea, currently on Zofran and compazine. He reports antiemetics are not working as he vomits the medication out. He smokes Marijuana daily.    INTERVAL HISTORY Nicholas Pruitt is a 50 y.o. male who has above history reviewed by me today presents for follow up visit for management of metastatic melanoma. Problems and complaints are listed below: Patient was accompanied by his twin brother. He has met palliative care service [facility] and is now on OxyContin in combination with oxycodone. Continues to have nausea.  Zofran ODT partially relieves his symptoms. Very low appetite, poor oral intake.  Review of Systems  Constitutional: Positive for appetite change, fatigue and unexpected weight change. Negative for chills and fever.  HENT:   Negative for hearing loss and voice change.   Eyes: Negative for eye problems and icterus.  Respiratory: Negative for chest tightness, cough and shortness of breath.   Cardiovascular: Negative for chest pain and leg swelling.  Gastrointestinal: Positive for nausea and vomiting. Negative for abdominal distention and abdominal pain.  Endocrine: Negative for hot flashes.  Genitourinary: Negative for difficulty urinating, dysuria and frequency.   Musculoskeletal: Negative for arthralgias.       Left axillary mass pain  Skin: Negative for itching and rash.  Neurological: Negative for light-headedness and numbness.  Hematological: Negative for adenopathy. Does not bruise/bleed easily.  Psychiatric/Behavioral: Negative for confusion.    MEDICAL HISTORY:  Past Medical History:  Diagnosis Date  . Metastatic melanoma ([place])     SURGICAL HISTORY: History reviewed. No pertinent surgical history.  SOCIAL HISTORY: Social History   Socioeconomic History  . Marital status: Widowed    Spouse name: Not on file  . Number of children: Not on file  . Years of education: Not on file  . Highest education level: Not on file   Occupational History  . Occupation: disabled  Tobacco Use  . Smoking status: Current Every Day Smoker    Packs/day: 1.50    Years: 20.00    Pack years: 30.00    Types: Cigarettes  . Smokeless tobacco: Never Used  Vaping Use  . Vaping Use: Never used  Substance and Sexual Activity  . Alcohol use: Not Currently  . Drug use: Yes    Types: Marijuana    Comment: smokes marijuana daily  . Sexual activity: Not on file  Other Topics Concern  . Not on file  Social History Narrative   ** Merged History Encounter **       Social Determinants of Health   Financial Resource Strain: Not on file  Food Insecurity: Not on file  Transportation Needs: Not on file  Physical Activity: Not on file  Stress: Not on file  Social Connections: Not on file  Intimate Partner Violence: Not on file    FAMILY HISTORY: Family History  Problem Relation Age of Onset  . Breast cancer Mother   . Kidney cancer Mother     ALLERGIES:  has No Known Allergies.  MEDICATIONS:  Current Outpatient Medications  Medication Sig Dispense Refill  . Docusate Sodium (DSS) 100 MG CAPS Take 100 mg by mouth in the morning and at bedtime.    . gabapentin (NEURONTIN) 100 MG capsule Take 100 mg by mouth in the morning, at noon, and at bedtime.    . naloxone (NARCAN) nasal spray 4 mg/0.1 mL Place into the nose. Place 1 spray (4 mg total) into one nostril once as needed (if  not breathing or overdose is suspected.) for up to 1 dose Give 2nd dose in 5-10 min if not responding or if sx return for up to 1 dose    . ondansetron (ZOFRAN ODT) 8 MG disintegrating tablet Take 1 tablet (8 mg total) by mouth every 8 (eight) hours as needed for nausea or vomiting. 90 tablet 0  . oxyCODONE (OXYCONTIN) 20 mg 12 hr tablet Take 1 tablet (20 mg total) by mouth every 12 (twelve) hours. 30 tablet 0  . Oxycodone HCl 10 MG TABS Take 1 tablet (10 mg total) by mouth every 4 (four) hours as needed. 60 tablet 0  . potassium chloride (KLOR-CON) 10  MEQ tablet Take 10 mEq by mouth 2 (two) times daily.    . prochlorperazine (COMPAZINE) 10 MG tablet Take by mouth. Take 1 tablet (10 mg total) by mouth every 6 (six) hours as needed for Nausea     No current facility-administered medications for this visit.     PHYSICAL EXAMINATION: ECOG PERFORMANCE STATUS: 1 - Symptomatic but completely ambulatory Vitals:   01/29/21 0910  BP: 113/78  Pulse: (!) 122  Resp: 18  Temp: (!) 94.2 F (34.6 C)  SpO2: 100%   Filed Weights   01/29/21 0910  Weight: 86 lb 1.6 oz (39.1 kg)    Physical Exam Constitutional:      Comments: Some distress due to the pain.  HENT:     Head: Normocephalic.  Eyes:     General: No scleral icterus. Cardiovascular:     Rate and Rhythm: Normal rate and regular rhythm.     Heart sounds: Normal heart sounds.  Pulmonary:     Effort: Pulmonary effort is normal. No respiratory distress.     Breath sounds: No wheezing.  Abdominal:     General: Bowel sounds are normal. There is no distension.     Palpations: Abdomen is soft.  Musculoskeletal:        General: No deformity. Normal range of motion.     Cervical back: Normal range of motion and neck supple.     Comments: Left axillary mass with focal erythema. Tender with palpation.   Skin:    General: Skin is warm and dry.  Neurological:     Mental Status: He is alert and oriented to person, place, and time. Mental status is at baseline.     Cranial Nerves: No cranial nerve deficit.     Coordination: Coordination normal.       LABORATORY DATA:  I have reviewed the data as listed Lab Results  Component Value Date   WBC 14.5 (H) 01/29/2021   HGB 9.2 (L) 01/29/2021   HCT 29.2 (L) 01/29/2021   MCV 75.3 (L) 01/29/2021   PLT 219 01/29/2021   Recent Labs    01/08/21 1627 01/16/21 0855 01/29/21 0822  NA 137 138 133*  K 3.8 4.3 4.2  CL 97* 99 90*  CO2 26 26 27   GLUCOSE 79 110* 121*  BUN 9 12 17   CREATININE 0.50* 0.63 0.66  CALCIUM 9.6 9.9 10.9*   GFRNONAA >60 >60 >60  PROT 8.3* 8.6* 8.2*  ALBUMIN 3.3* 3.3* 2.9*  AST 17 17 39  ALT 5 7 11   ALKPHOS 56 55 111  BILITOT 0.4 0.6 1.1   Iron/TIBC/Ferritin/ %Sat No results found for: IRON, TIBC, FERRITIN, IRONPCTSAT    RADIOGRAPHIC STUDIES: I have personally reviewed the radiological images as listed and agreed with the findings in the report. No results found.  I reviewed  his previous CT and PET scans that were done at [facility]. Images were not available.  ASSESSMENT & PLAN:  1. Neoplasm related pain   2. Nausea and vomiting, intractability of vomiting not specified, unspecified vomiting type   3. Metastatic melanoma ([place])   4. Goals of care, counseling/discussion   Cancer Staging Metastatic melanoma [facility]) Staging form: Melanoma of the Skin, AJCC 8th Edition - Clinical stage from 01/08/2021: Stage IV (ycTX, cNX, cM1) - Signed by Earlie Server, MD on 01/08/2021   # Stage IV metastatic melanoma, [id number] His first treatment was delayed and did not happen after last visit due to insurance approval. Is now going to start palliative radiation to his axillary mass. I discussed with Dr. Baruch Gouty who informed me that he will get minimal dosage in his lungs. I will proceed with nivolumab 1 mg per metered squared today.  Will start palliative radiation in 2 days. Hold ipilimumab due to being on radiation.  We will add ipilimumab once he finishes radiation.   # Nausea/vomiting.  Poor oral intake.  Patient will proceed with IV hydration with 1 L of normal saline, IV Zofran. continue Zofran ODT as needed. #Hypercalcemia is likely due to decreased oral intake and dehydration. # Neoplasm related pain.  Recommend him to continue Oxycodone 74m Q4 hours as needed.  Patient is in the process of transition from fentanyl patch to OxyContin 20 mg every 12 hours. Recommend continue gabapentin 100 mg 3 times daily.  Titrate as needed.  #Protein calorie malnutrition.  Recommend nutrition  supplementation. #Follow-up with radiation oncology for palliative radiation.  Supportive care measures are necessary for patient well-being and will be provided as necessary. We spent sufficient time to discuss many aspect of care, questions were answered to patient's satisfaction.  Follow up in 1 week.  Orders Placed This Encounter  Procedures  . Comprehensive metabolic panel    Standing Status:   Future    Standing Expiration Date:   01/29/2022  . CBC with Differential/Platelet    Standing Status:   Future    Standing Expiration Date:   01/29/2022  . Retic Panel    Standing Status:   Future    Standing Expiration Date:   01/29/2022  . Ferritin    Standing Status:   Future    Standing Expiration Date:   07/29/2021  . Iron and TIBC    Standing Status:   Future    Standing Expiration Date:   01/29/2022    All questions were answered. The patient knows to call the clinic with any problems questions or concerns.  ZEarlie Server MD, PhD Hematology Oncology CSheridan Memorial Hospitalat AOdessa Regional Medical CenterPager- [phone]/16/2022

## 2021-01-29 NOTE — Progress Notes (Signed)
[place]  Telephone:([phone] [phone]   Name: Nicholas Pruitt Date: 01/29/2021 MRN: [id number]  DOB: 1971-09-21  Patient Care Team: Franklyn Lor, MD as PCP - General (Internal Medicine) Patient, No Pcp Per (General Practice)    REASON FOR CONSULTATION: Nicholas Pruitt is a 50 y.o. male with multiple medical problems including metastatic melanoma extensively involving the left axilla status post XRT and chemo currently on Nivolumab + Ipilimumab.  Patient has had intractable pain.  Patient was previously followed by home-based palliative care.  He was seen by [facility] interventional pain clinic for consideration of intercostal nerve block but ultimately canceled follow-up.  He is now referred to palliative care to help address goals and manage ongoing symptoms.    SOCIAL HISTORY:     reports that he has been smoking cigarettes. He has a 30.00 pack-year smoking history. He has never used smokeless tobacco. He reports previous alcohol use. He reports current drug use. Drug: Marijuana.  Patient is a widower.  He lives at home with his twin brother. He has two adult children.  Patient formally worked in a [employer].  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  ADVANCE DIRECTIVES:  Not on file  CODE STATUS:   PAST MEDICAL HISTORY: Past Medical History:  Diagnosis Date  . Metastatic melanoma ([place])     PAST SURGICAL HISTORY: No past surgical history on file.  HEMATOLOGY/ONCOLOGY HISTORY:  Oncology History  Metastatic melanoma ([place])  01/08/2021 Initial Diagnosis   Metastatic melanoma ([place])   01/08/2021 Cancer Staging   Staging form: Melanoma of the Skin, AJCC 8th Edition - Clinical stage from 01/08/2021: Stage IV (ycTX, cNX, cM1) - Signed by Earlie Server, MD on 01/08/2021   01/29/2021 -  Chemotherapy    Patient is on Treatment Plan: MELANOMA NIVOLUMAB + IPILIMUMAB (1/3) Q21D /  NIVOLUMAB Q14D        ALLERGIES:  has No Known Allergies.  MEDICATIONS:  Current Outpatient Medications  Medication Sig Dispense Refill  . Docusate Sodium (DSS) 100 MG CAPS Take 100 mg by mouth in the morning and at bedtime.    . gabapentin (NEURONTIN) 100 MG capsule Take 100 mg by mouth in the morning, at noon, and at bedtime.    . naloxone (NARCAN) nasal spray 4 mg/0.1 mL Place into the nose. Place 1 spray (4 mg total) into one nostril once as needed (if not breathing or overdose is suspected.) for up to 1 dose Give 2nd dose in 5-10 min if not responding or if sx return for up to 1 dose    . ondansetron (ZOFRAN ODT) 8 MG disintegrating tablet Take 1 tablet (8 mg total) by mouth every 8 (eight) hours as needed for nausea or vomiting. 90 tablet 0  . oxyCODONE (OXYCONTIN) 20 mg 12 hr tablet Take 1 tablet (20 mg total) by mouth every 12 (twelve) hours. 30 tablet 0  . Oxycodone HCl 10 MG TABS Take 1 tablet (10 mg total) by mouth every 4 (four) hours as needed. 60 tablet 0  . potassium chloride (KLOR-CON) 10 MEQ tablet Take 10 mEq by mouth 2 (two) times daily.    . prochlorperazine (COMPAZINE) 10 MG tablet Take by mouth. Take 1 tablet (10 mg total) by mouth every 6 (six) hours as needed for Nausea     No current facility-administered medications for this visit.    VITAL SIGNS: There were no vitals taken for  this visit. There were no vitals filed for this visit.  There is no height or weight on file to calculate BMI.  LABS: CBC:    Component Value Date/Time   WBC 14.5 (H) 01/29/2021 0822   HGB 9.2 (L) 01/29/2021 0822   HCT 29.2 (L) 01/29/2021 0822   PLT 219 01/29/2021 0822   MCV 75.3 (L) 01/29/2021 0822   NEUTROABS 11.9 (H) 01/29/2021 0822   LYMPHSABS 0.8 01/29/2021 0822   MONOABS 1.3 (H) 01/29/2021 0822   EOSABS 0.0 01/29/2021 0822   BASOSABS 0.0 01/29/2021 0822   Comprehensive Metabolic Panel:    Component Value Date/Time   NA 133 (L) 01/29/2021 0822   K 4.2 01/29/2021  0822   CL 90 (L) 01/29/2021 0822   CO2 27 01/29/2021 0822   BUN 17 01/29/2021 0822   CREATININE 0.66 01/29/2021 0822   GLUCOSE 121 (H) 01/29/2021 0822   CALCIUM 10.9 (H) 01/29/2021 0822   AST 39 01/29/2021 0822   ALT 11 01/29/2021 0822   ALKPHOS 111 01/29/2021 0822   BILITOT 1.1 01/29/2021 0822   PROT 8.2 (H) 01/29/2021 0822   ALBUMIN 2.9 (L) 01/29/2021 7342    RADIOGRAPHIC STUDIES: No results found.  PERFORMANCE STATUS (ECOG) : 3 - Symptomatic, >50% confined to bed  Review of Systems Unless otherwise noted, a complete review of systems is negative.  Physical Exam General: NAD, frail, thin  Pulmonary: Unlabored Extremities: no edema, no joint deformities Skin: no rashes Neurological: Weakness but otherwise nonfocal  IMPRESSION: Follow-up visit.  Patient was accompanied by his brother.  Pain is reportedly slightly improved today with continued use of oxycodone.  However, patient has yet to pick up the OxyContin from the pharmacy.  We again discussed his opioid regimen in detail including plan for discontinuing fentanyl and starting OxyContin every 12 hours.  He can continue taking oxycodone IR every 3-4 hours as needed for breakthrough pain.  We also again discussed daily bowel regimen for management of opioid-induced constipation.  PLAN: -Continue current scope of treatment -Start OxyContin 20 mg every 12 hours with plan to titrate to every 8 hours if needed -Continue oxycodone IR 10 mg every 3 to 4 hours as needed for breakthrough pain -Continue gabapentin 100 mg 3 times daily and titrate as needed -Daily bowel regimen with senna/MiraLAX -RTC 2 weeks   Patient expressed understanding and was in agreement with this plan. He also understands that He can call the clinic at any time with any questions, concerns, or complaints.    Time Total: 15 minutes  Visit consisted of counseling and education dealing with the complex and emotionally intense issues of symptom  management and palliative care in the setting of serious and potentially life-threatening illness.Greater than 50%  of this time was spent counseling and coordinating care related to the above assessment and plan.  Signed by: Altha Harm, PhD, NP-C

## 2021-01-29 NOTE — Progress Notes (Signed)
MD reviewed vitals and labs, all ok to proceed with tx.  To get pepcid and benadryl with opdivo only today.  Confirmed with MD

## 2021-01-30 ENCOUNTER — Telehealth: Payer: Self-pay

## 2021-01-30 ENCOUNTER — Ambulatory Visit: Admission: RE | Admit: 2021-01-30 | Payer: 59 | Source: Ambulatory Visit

## 2021-01-30 DIAGNOSIS — Z510 Encounter for antineoplastic radiation therapy: Secondary | ICD-10-CM | POA: Diagnosis not present

## 2021-01-30 NOTE — Telephone Encounter (Signed)
T/C to pt for follow up after receiving 1st chemotherapy.  Phone rang numerous times but no one picked up and no answering machine came on nor voice mail so unable to leave message.

## 2021-02-03 ENCOUNTER — Ambulatory Visit: Payer: 59

## 2021-02-04 ENCOUNTER — Ambulatory Visit: Payer: 59

## 2021-02-05 ENCOUNTER — Ambulatory Visit: Payer: 59

## 2021-02-06 ENCOUNTER — Inpatient Hospital Stay: Payer: 59 | Admitting: Oncology

## 2021-02-06 ENCOUNTER — Inpatient Hospital Stay: Payer: 59

## 2021-02-06 ENCOUNTER — Ambulatory Visit: Payer: 59

## 2021-02-06 ENCOUNTER — Inpatient Hospital Stay: Payer: 59 | Admitting: Hospice and Palliative Medicine

## 2021-02-07 ENCOUNTER — Ambulatory Visit: Payer: 59

## 2021-02-09 ENCOUNTER — Other Ambulatory Visit: Payer: Self-pay

## 2021-02-09 ENCOUNTER — Emergency Department: Payer: 59

## 2021-02-09 ENCOUNTER — Inpatient Hospital Stay
Admission: EM | Admit: 2021-02-09 | Discharge: 2021-03-14 | DRG: 871 | Disposition: E | Payer: 59 | Attending: Internal Medicine | Admitting: Internal Medicine

## 2021-02-09 DIAGNOSIS — E43 Unspecified severe protein-calorie malnutrition: Secondary | ICD-10-CM | POA: Diagnosis present

## 2021-02-09 DIAGNOSIS — C439 Malignant melanoma of skin, unspecified: Secondary | ICD-10-CM | POA: Diagnosis present

## 2021-02-09 DIAGNOSIS — R54 Age-related physical debility: Secondary | ICD-10-CM | POA: Diagnosis present

## 2021-02-09 DIAGNOSIS — E872 Acidosis: Secondary | ICD-10-CM | POA: Diagnosis present

## 2021-02-09 DIAGNOSIS — E162 Hypoglycemia, unspecified: Secondary | ICD-10-CM | POA: Diagnosis present

## 2021-02-09 DIAGNOSIS — Z8051 Family history of malignant neoplasm of kidney: Secondary | ICD-10-CM

## 2021-02-09 DIAGNOSIS — R627 Adult failure to thrive: Secondary | ICD-10-CM | POA: Diagnosis present

## 2021-02-09 DIAGNOSIS — K7290 Hepatic failure, unspecified without coma: Secondary | ICD-10-CM | POA: Diagnosis present

## 2021-02-09 DIAGNOSIS — K7200 Acute and subacute hepatic failure without coma: Secondary | ICD-10-CM | POA: Diagnosis not present

## 2021-02-09 DIAGNOSIS — Z515 Encounter for palliative care: Secondary | ICD-10-CM | POA: Diagnosis not present

## 2021-02-09 DIAGNOSIS — R4182 Altered mental status, unspecified: Secondary | ICD-10-CM | POA: Diagnosis not present

## 2021-02-09 DIAGNOSIS — Z66 Do not resuscitate: Secondary | ICD-10-CM | POA: Diagnosis not present

## 2021-02-09 DIAGNOSIS — Z681 Body mass index (BMI) 19 or less, adult: Secondary | ICD-10-CM

## 2021-02-09 DIAGNOSIS — F17210 Nicotine dependence, cigarettes, uncomplicated: Secondary | ICD-10-CM | POA: Diagnosis present

## 2021-02-09 DIAGNOSIS — C774 Secondary and unspecified malignant neoplasm of inguinal and lower limb lymph nodes: Secondary | ICD-10-CM | POA: Diagnosis present

## 2021-02-09 DIAGNOSIS — D6959 Other secondary thrombocytopenia: Secondary | ICD-10-CM | POA: Diagnosis present

## 2021-02-09 DIAGNOSIS — D689 Coagulation defect, unspecified: Secondary | ICD-10-CM | POA: Diagnosis present

## 2021-02-09 DIAGNOSIS — R6520 Severe sepsis without septic shock: Secondary | ICD-10-CM | POA: Diagnosis not present

## 2021-02-09 DIAGNOSIS — R579 Shock, unspecified: Secondary | ICD-10-CM | POA: Diagnosis present

## 2021-02-09 DIAGNOSIS — N390 Urinary tract infection, site not specified: Secondary | ICD-10-CM | POA: Diagnosis present

## 2021-02-09 DIAGNOSIS — A419 Sepsis, unspecified organism: Principal | ICD-10-CM | POA: Diagnosis present

## 2021-02-09 DIAGNOSIS — D696 Thrombocytopenia, unspecified: Secondary | ICD-10-CM | POA: Diagnosis not present

## 2021-02-09 DIAGNOSIS — Z803 Family history of malignant neoplasm of breast: Secondary | ICD-10-CM

## 2021-02-09 DIAGNOSIS — R64 Cachexia: Secondary | ICD-10-CM | POA: Diagnosis present

## 2021-02-09 DIAGNOSIS — N179 Acute kidney failure, unspecified: Secondary | ICD-10-CM | POA: Diagnosis not present

## 2021-02-09 DIAGNOSIS — C799 Secondary malignant neoplasm of unspecified site: Secondary | ICD-10-CM | POA: Diagnosis not present

## 2021-02-09 DIAGNOSIS — D62 Acute posthemorrhagic anemia: Secondary | ICD-10-CM | POA: Diagnosis present

## 2021-02-09 DIAGNOSIS — Z923 Personal history of irradiation: Secondary | ICD-10-CM | POA: Diagnosis not present

## 2021-02-09 DIAGNOSIS — K625 Hemorrhage of anus and rectum: Secondary | ICD-10-CM | POA: Diagnosis present

## 2021-02-09 DIAGNOSIS — G9341 Metabolic encephalopathy: Secondary | ICD-10-CM | POA: Diagnosis present

## 2021-02-09 DIAGNOSIS — Z7189 Other specified counseling: Secondary | ICD-10-CM | POA: Diagnosis not present

## 2021-02-09 LAB — CBC WITH DIFFERENTIAL/PLATELET
Abs Immature Granulocytes: 2.13 10*3/uL — ABNORMAL HIGH (ref 0.00–0.07)
Basophils Absolute: 0.1 10*3/uL (ref 0.0–0.1)
Basophils Relative: 0 %
Eosinophils Absolute: 0.3 10*3/uL (ref 0.0–0.5)
Eosinophils Relative: 1 %
HCT: 27.1 % — ABNORMAL LOW (ref 39.0–52.0)
Hemoglobin: 8 g/dL — ABNORMAL LOW (ref 13.0–17.0)
Immature Granulocytes: 10 %
Lymphocytes Relative: 7 %
Lymphs Abs: 1.6 10*3/uL (ref 0.7–4.0)
MCH: 23.6 pg — ABNORMAL LOW (ref 26.0–34.0)
MCHC: 29.5 g/dL — ABNORMAL LOW (ref 30.0–36.0)
MCV: 79.9 fL — ABNORMAL LOW (ref 80.0–100.0)
Monocytes Absolute: 0.8 10*3/uL (ref 0.1–1.0)
Monocytes Relative: 4 %
Neutro Abs: 17.5 10*3/uL — ABNORMAL HIGH (ref 1.7–7.7)
Neutrophils Relative %: 78 %
Platelets: 8 10*3/uL — CL (ref 150–400)
RBC: 3.39 MIL/uL — ABNORMAL LOW (ref 4.22–5.81)
RDW: 15.9 % — ABNORMAL HIGH (ref 11.5–15.5)
Smear Review: DECREASED
WBC: 22.3 10*3/uL — ABNORMAL HIGH (ref 4.0–10.5)
nRBC: 0.7 % — ABNORMAL HIGH (ref 0.0–0.2)

## 2021-02-09 LAB — URINALYSIS, COMPLETE (UACMP) WITH MICROSCOPIC
Bilirubin Urine: NEGATIVE
Glucose, UA: NEGATIVE mg/dL
Ketones, ur: 5 mg/dL — AB
Nitrite: NEGATIVE
Protein, ur: 100 mg/dL — AB
RBC / HPF: >50 RBC/hpf — ABNORMAL HIGH (ref 0–5)
Specific Gravity, Urine: 1.015 (ref 1.005–1.030)
pH: 5 (ref 5.0–8.0)

## 2021-02-09 LAB — COMPREHENSIVE METABOLIC PANEL
ALT: 256 U/L — ABNORMAL HIGH (ref 0–44)
AST: 590 U/L — ABNORMAL HIGH (ref 15–41)
Albumin: 2.5 g/dL — ABNORMAL LOW (ref 3.5–5.0)
Alkaline Phosphatase: 469 U/L — ABNORMAL HIGH (ref 38–126)
Anion gap: 34 — ABNORMAL HIGH (ref 5–15)
BUN: 95 mg/dL — ABNORMAL HIGH (ref 6–20)
CO2: 8 mmol/L — ABNORMAL LOW (ref 22–32)
Calcium: 9.7 mg/dL (ref 8.9–10.3)
Chloride: 90 mmol/L — ABNORMAL LOW (ref 98–111)
Creatinine, Ser: 1.84 mg/dL — ABNORMAL HIGH (ref 0.61–1.24)
GFR, Estimated: 45 mL/min — ABNORMAL LOW (ref >60)
Glucose, Bld: 204 mg/dL — ABNORMAL HIGH (ref 70–99)
Potassium: 5 mmol/L (ref 3.5–5.1)
Sodium: 132 mmol/L — ABNORMAL LOW (ref 135–145)
Total Bilirubin: 2.7 mg/dL — ABNORMAL HIGH (ref 0.3–1.2)
Total Protein: 5.9 g/dL — ABNORMAL LOW (ref 6.5–8.1)

## 2021-02-09 LAB — CBG MONITORING, ED
Glucose-Capillary: 84 mg/dL (ref 70–99)
Glucose-Capillary: 108 mg/dL — ABNORMAL HIGH (ref 70–99)

## 2021-02-09 LAB — TYPE AND SCREEN
ABO/RH(D): O POS
Antibody Screen: NEGATIVE

## 2021-02-09 LAB — PROTIME-INR
INR: 2.1 — ABNORMAL HIGH (ref 0.8–1.2)
Prothrombin Time: 22.6 seconds — ABNORMAL HIGH (ref 11.4–15.2)

## 2021-02-09 LAB — LACTIC ACID, PLASMA
Lactic Acid, Venous: >11 mmol/L (ref 0.5–1.9)
Lactic Acid, Venous: >11 mmol/L (ref 0.5–1.9)

## 2021-02-09 LAB — AMMONIA: Ammonia: 42 umol/L — ABNORMAL HIGH (ref 9–35)

## 2021-02-09 LAB — APTT: aPTT: 39 seconds — ABNORMAL HIGH (ref 24–36)

## 2021-02-09 LAB — ABO/RH: ABO/RH(D): O POS

## 2021-02-09 MED ORDER — SODIUM BICARBONATE 8.4 % IV SOLN
INTRAVENOUS | Status: DC
  Filled 2021-02-09 (×5): qty 850

## 2021-02-09 MED ORDER — VANCOMYCIN HCL 750 MG/150ML IV SOLN
750.0000 mg | Freq: Once | INTRAVENOUS | Status: AC
  Administered 2021-02-09: 750 mg via INTRAVENOUS
  Filled 2021-02-09: qty 150

## 2021-02-09 MED ORDER — METRONIDAZOLE IN NACL 5-0.79 MG/ML-% IV SOLN
500.0000 mg | Freq: Once | INTRAVENOUS | Status: AC
  Administered 2021-02-09: 500 mg via INTRAVENOUS
  Filled 2021-02-09: qty 100

## 2021-02-09 MED ORDER — THIAMINE HCL 100 MG/ML IJ SOLN
100.0000 mg | Freq: Every day | INTRAMUSCULAR | Status: DC
  Administered 2021-02-10: 100 mg via INTRAVENOUS
  Filled 2021-02-09: qty 2

## 2021-02-09 MED ORDER — SODIUM CHLORIDE 0.9 % IV BOLUS
1000.0000 mL | Freq: Once | INTRAVENOUS | Status: AC
  Administered 2021-02-09: 1000 mL via INTRAVENOUS

## 2021-02-09 MED ORDER — GABAPENTIN 100 MG PO CAPS
100.0000 mg | ORAL_CAPSULE | Freq: Three times a day (TID) | ORAL | Status: DC
  Administered 2021-02-10: 100 mg via ORAL
  Filled 2021-02-09: qty 1

## 2021-02-09 MED ORDER — LACTATED RINGERS IV BOLUS (SEPSIS)
500.0000 mL | Freq: Once | INTRAVENOUS | Status: AC
  Administered 2021-02-09: 500 mL via INTRAVENOUS

## 2021-02-09 MED ORDER — LACTATED RINGERS IV BOLUS (SEPSIS)
1000.0000 mL | Freq: Once | INTRAVENOUS | Status: AC
  Administered 2021-02-09: 1000 mL via INTRAVENOUS

## 2021-02-09 MED ORDER — VANCOMYCIN HCL IN DEXTROSE 1-5 GM/200ML-% IV SOLN
1000.0000 mg | Freq: Once | INTRAVENOUS | Status: DC
  Filled 2021-02-09: qty 200

## 2021-02-09 MED ORDER — SODIUM CHLORIDE 0.9 % IV SOLN
2.0000 g | INTRAVENOUS | Status: DC
  Filled 2021-02-09: qty 2

## 2021-02-09 MED ORDER — ENOXAPARIN SODIUM 40 MG/0.4ML ~~LOC~~ SOLN: 40.0000 mg | SUBCUTANEOUS | Status: DC

## 2021-02-09 MED ORDER — FENTANYL CITRATE (PF) 100 MCG/2ML IJ SOLN
25.0000 ug | INTRAMUSCULAR | Status: DC | PRN
  Administered 2021-02-10 – 2021-02-11 (×5): 25 ug via INTRAVENOUS
  Filled 2021-02-09 (×5): qty 2

## 2021-02-09 MED ORDER — OXYCODONE HCL 5 MG PO TABS: 10.0000 mg | ORAL_TABLET | ORAL | Status: DC | PRN

## 2021-02-09 MED ORDER — SODIUM CHLORIDE 0.9% IV SOLUTION: Freq: Once | INTRAVENOUS | Status: AC

## 2021-02-09 MED ORDER — LACTATED RINGERS IV SOLN: INTRAVENOUS | Status: DC

## 2021-02-09 MED ORDER — SODIUM CHLORIDE 0.9 % IV SOLN
2.0000 g | Freq: Once | INTRAVENOUS | Status: AC
  Administered 2021-02-09: 2 g via INTRAVENOUS
  Filled 2021-02-09: qty 2

## 2021-02-09 MED ORDER — OXYCODONE HCL ER 10 MG PO T12A: 20.0000 mg | EXTENDED_RELEASE_TABLET | Freq: Two times a day (BID) | ORAL | Status: DC

## 2021-02-09 NOTE — Progress Notes (Signed)
[facility] following for sepsis protocol. 

## 2021-02-09 NOTE — ED Notes (Signed)
XR completed

## 2021-02-09 NOTE — ED Notes (Signed)
Medication Reconciliation Report  For Home History Technicians  HIGHLIGHTS:  1. The patient WAS NOT personally interviewed 2. If not, what was the main source used: PHARMACY RECORDS 3. Does the patient appear to take any anti-coagulation agents (e.g. warfarin, Eliquis or Xarelto): NO 4. Does the patient appear to take any anti-convulsant agents (e.g. divalproex, levetiracetam or phenytoin): NO 5. Does the patient appear to use any insulin products (e.g. Lantus, Novolin or Humalog): NO 6. Does the patient appear to take any "beta-blockers" (e.g. metoprolol, carvedilol or bisoprolol: NO  BARRIERS:  1. Were there any barriers that prevented or complicated the medication reconciliation process: YES 2. If yes, what was the primary barrier encountered: Condition prevents interview 3. Does the patient appear compliant with prescribed medications: UNABLE TO DETERMINE 4. Does the patient express any barriers with compliance: UNABLE TO DETERMINE 5. What is the primary barrier the patient reports: None   NOTES:[Include any concerns, remarks or complaints the patient expresses regarding medication therapy. Any observations or other information that might be useful to the treatment team can also be included. Immediate needs or concerns should be referred to the RN or appropriate member of the treatment team.]  Patient was not interviewed secondary to AMS. Pharmacy records and chart notes from [facility] indicate no longer taking oral cancer medications. Medications appear to consist mostly of pain management and antinausea. According to multiple members of the care team, patient is likely to be comfort care.  Colen Darling, CPhT [place] at [facility] [place]. [place], [place] [address] [phone]  ** The above is intended solely for informational and/or communicative purposes. It should in no way be considered an endorsement of any specific treatment,  therapy or action. **

## 2021-02-09 NOTE — H&P (Addendum)
History and Physical   Nicholas Pruitt POE:[id number] DOB: Mar 04, 1971 DOA: 02/02/2021  PCP: Nicholas Lor, MD  Outpatient Specialists: Dr. Lolita Pruitt, [place] Patient coming from: home  I have personally briefly reviewed patient's old medical records in [place].  Chief Concern: Altered mentation and poor p.o. intake  HPI: Nicholas Pruitt is a 50 y.o. male with medical history significant for Metastatic melanoma, to the left axillary mass, presented to the emergency department from home for chief concerns of altered mentation and poor p.o. intake.  He lives with his twin brother who has been feeding him fluids with a straw.  At bedside, patient is cachectic, wasting, frail, appears to be dying from metastatic cancer.  He is able to tell me his name and not able to tell me his age or current location or current calendar year.  His speech is garbled and difficult to understand.  He states that he is hurting.  ROS: Unable to complete due to altered mentation  ED Course: Discussed with ED provider, patient requiring hospitalization due to prep sepsis-like presentation.  However patient is appropriate for comfort care.  Patient follows with palliative care.  EDP spoke with brother who patient lives with, patient has been discussing with brother that he would like to be DNR however this is not in the system.  Per oncology note, patient has 2 adult children and living parents.  Vitals in the emergency department was remarkable for respiration rate of 23, heart rate of 113, blood pressure 114/90, satting at 94% on room air.  A temperature was attempted by RN multiple times however unable to register in the device.  Attempts at a rectal temperature in my opinion would cause patient possible discomfort.  Lactic acid was elevated at 11, WBC 22.3, hemoglobin 8, hematocrit 27.1, platelets is pending.  Labs were remarkable for serum creatinine of 1.84, sodium 132, chloride  90, bicarb 8, glucose 204, AST 590, ALT 256, alk phos 469, T bili 2.7, EGFR 45.  Assessment/Plan  Active Problems:   Metastatic melanoma (H[facility])   Sepsis ([place])   Metastatic melanoma Meets sepsis criteria, however at this time source is unknown and therefore suspect secondary to metastatic melanoma Multiorgan failure, including acute renal injury, acute hepatitis -Patient is actively dying and appropriate for comfort care -Palliative consult has been placed via epic -LR 150 mL/h, cefepime per pharmacy, thanks per pharmacy -Thiamine 100 mg IV daily -Further treatment we will be futile care -Aspiration precautions -A.m. team to discuss with palliative care via secure chat  Status post lactated ringer 1 L normal saline bolus, 1.5 L LR bolus, metronidazole, vancomycin, cefepime per ED provider  Severe thrombocytopenia-platelets less than 10 -No signs of active bleeding at this time -Type and screen -1 unit of platelets ordered for transfusion -Unable to obtain consent due to patient altered mental status and family unreachable  HCPOA DNR-as chest compression and intubation will be futile care and will cause patient unnecessary pain and suffering -On chart review, [facility] (12/02/2020), patient was recently separated from his wife in Sep 27, 2023 then she passed away due to acute alcohol liver failure.  He does have a twin brother, Nicholas Pruitt who is preferred healthcare power of attorney brother.  Patient at that time endorses his daughter does not have much to do with as she has a drug problem.  Patient endorses that his son just recently graduated from high school.  Chart reviewed.   01/02/2020: Evaluated by Dr. 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 01/24/2020: Orthopedic surgery was seen in evaluation for further need of  biopsy 01/25/2020: At least 2 large discrete oval left axillary and left subpectoral masses, slightly increased in size compared to 01/03/2020 MRI, left upper lobe 5 mm groundglass pulmonary nodule 01/29/2020: Orthopedic surgeon biopsied left axillary mass consistent with metastatic melanoma, diffusely positive for SOX10, focal positive for S100, negative for pancytokeratin and TTF-1 Immunohistochemistry for Mart/HMB-45 and MITF is strongly and diffusely positive, further supporting diagnosis of metastatic melanoma 02/14/2020: Large left axillary and left subpectoral hypermetabolic mass, consistent with biopsy-proven melanoma.  Hypermetabolic soft tissue mass in the gallbladder fundus concerning for metastatic or primary malignancy.  Metastatic right inguinal lymph nodes.  Hypermetabolic left thyroid lobe nodule.  Small left upper lobe groundglass nodule without FDG activity. 04/03/2020: Encorafenib/binimetibib approved with financial assistance, pain management discussed 04/17/2020: Taking medications incorrectly.  Encorafenib 6 pills daily and binimetinib 3 tablets twice daily.  06/03/2020: CT chest/abdomen/pelvis: Increased size and central necrosis of the subpectoral component of the left axillary mass, lateral axillary component unchanged, decreased in size of right inguinal adenopathy 09/11/2020: Echo showed ejection fraction of 44% 10/16/2020: Patient was referred to palliative medicine, instructed to stop enco/bini 09/13/20, not taking consistently due to nausea/vomiting and missed several doses. 11/27/2020: Completed 2 of 3 doses of radiation to left axilla 12/16/2020: Completed radiation  DVT prophylaxis: ted hose Code Status: DNR Diet: N.p.o. except for sips of meds, ice chips, advance as tolerated Family Communication: Attempted to call brother, Nicholas Pruitt at [phone] no one answered and called Nicholas Pruitt's listed home phone number with no pick up. Disposition Plan: Poor prognosis Consults called:  Palliative Admission status: Inpatient-anticipate in-hospital death or discharge to comfort care outpatient  Past Medical History:  Diagnosis Date  . Metastatic melanoma ([place])    No past surgical history on file.  Social History:  reports that he has been smoking cigarettes. He has a 30.00 pack-year smoking history. He has never used smokeless tobacco. He reports previous alcohol use. He reports current drug use. Drug: Marijuana.  No Known Allergies Family History  Problem Relation Age of Onset  . Breast cancer Mother   . Kidney cancer Mother    Family history: Family history reviewed and pertinent for breast and kidney cancer in his mother  Prior to Admission medications   Medication Sig Start Date End Date Taking? Authorizing Provider  Docusate Sodium (DSS) 100 MG CAPS Take 100 mg by mouth in the morning and at bedtime.    [provider]  gabapentin (NEURONTIN) 100 MG capsule Take 100 mg by mouth in the morning, at noon, and at bedtime. 10/23/20 10/23/21  [provider]  naloxone [facility]) nasal spray 4 mg/0.1 mL Place into the nose. Place 1 spray (4 mg total) into one nostril once as needed (if not breathing or overdose is suspected.) for up to 1 dose Give 2nd dose in 5-10 min if not responding or if sx return for up to 1 dose 10/21/20   [provider]  ondansetron (ZOFRAN ODT) 8 MG disintegrating tablet Take 1 tablet (8 mg total) by mouth every 8 (eight) hours as needed for nausea or vomiting. 01/08/21   Earlie Server, MD  oxyCODONE (OXYCONTIN) 20 mg 12 hr tablet Take 1 tablet (20 mg total) by mouth every 12 (twelve) hours. 01/27/21  Borders, Kirt Boys, NP  Oxycodone HCl 10 MG TABS Take 1 tablet (10 mg total) by mouth every 4 (four) hours as needed. 01/27/21   Borders, Kirt Boys, NP  potassium chloride (KLOR-CON) 10 MEQ tablet Take 10 mEq by mouth 2 (two) times daily. 08/15/20   [provider]  prochlorperazine (COMPAZINE) 10 MG tablet Take by mouth. Take 1  tablet (10 mg total) by mouth every 6 (six) hours as needed for Nausea 11/15/20   [provider]   Physical Exam: Vitals:   01/23/2021 1720 01/23/2021 1730 01/29/2021 1830  BP:  118/87 114/90  Pulse: (!) 114 (!) 114 (!) 113  Resp: 18 (!) 26 20  SpO2: 90% 94% 94%   Constitutional: appears frail, cachectic, wasting, dying Eyes: PERRL, lids and conjunctivae normal ENMT: Mucous membranes are dry. Posterior pharynx clear of any exudate or lesions.  Poor dentition. Hearing appropriate Neck: midline, thin,  Respiratory: clear to auscultation bilaterally, no wheezing, no crackles. Normal respiratory effort. No accessory muscle use.  Cardiovascular: Tachycardic and regular rate, no murmurs / rubs / gallops. No extremity edema. 2+ pedal pulses. No carotid bruits.  Abdomen: Scaphoid abdomen, firm, mild tenderness.  Musculoskeletal: Bilateral upper and lower extremity atrophy, muscle wasting, weak  skin: Pale and mildly jaundiced Neurologic: Sensation intact. Strength 3/5 in all 4.  Psychiatric: Alert and oriented x self.  Not able to tell me his age, or current location.  EKG: independently reviewed, showing sinus tachycardia with rate of 118, QTc 496  Chest x-ray on Admission: I personally reviewed and I agree with radiologist reading as below.  CT Head Wo Contrast  Result Date: 01/16/2021 CLINICAL DATA:  Delirium EXAM: CT HEAD WITHOUT CONTRAST TECHNIQUE: Contiguous axial images were obtained from the base of the skull through the vertex without intravenous contrast. COMPARISON:  None. FINDINGS: Brain: No evidence of large-territorial acute infarction. No parenchymal hemorrhage. No mass lesion. No extra-axial collection. No mass effect or midline shift. No hydrocephalus. Basilar cisterns are patent. Vascular: No hyperdense vessel. Skull: No acute fracture or focal lesion. Sinuses/Orbits: Paranasal sinuses and mastoid air cells are clear. The orbits are unremarkable. Other: None. IMPRESSION: No  acute intracranial abnormality. Electronically Signed   By: Iven Finn M.D.   On: 01/19/2021 18:10   DG Chest Port 1 View  Result Date: 02/05/2021 CLINICAL DATA:  Questionable sepsis.  History of metastatic melanoma EXAM: PORTABLE CHEST 1 VIEW COMPARISON:  October 30, 2020 FINDINGS: The cardiomediastinal silhouette is normal in contour. No pleural effusion. No pneumothorax. No acute pleuroparenchymal abnormality. Visualized abdomen is unremarkable. Marked soft tissue expansion of the LEFT axilla consistent with known metastatic melanoma. No acute osseous abnormality. IMPRESSION: 1. No acute cardiopulmonary abnormality. 2. Marked soft tissue expansion of the LEFT axilla consistent with known metastatic melanoma. Electronically Signed   By: Valentino Saxon MD   On: 01/17/2021 17:32   Labs on Admission: I have personally reviewed following labs  CBC: Recent Labs  Lab 02/05/2021 1724  WBC 22.3*  NEUTROABS 17.5*  HGB 8.0*  HCT 27.1*  MCV 79.9*  PLT 8*   Basic Metabolic Panel: Recent Labs  Lab 01/24/2021 1724  NA 132*  K 5.0  CL 90*  CO2 8*  GLUCOSE 204*  BUN 95*  CREATININE 1.84*  CALCIUM 9.7   Liver Function Tests: Recent Labs  Lab 02/05/2021 1724  AST 590*  ALT 256*  ALKPHOS 469*  BILITOT 2.7*  PROT 5.9*  ALBUMIN 2.5*   Recent Labs  Lab 02/08/2021 1724  AMMONIA 42*   Coagulation Profile: Recent Labs  Lab 02/10/2021 1724  INR 2.1*   CBG: Recent Labs  Lab 01/19/2021 1702 01/23/2021 1811  GLUCAP 84 108*   Urine analysis:    Component Value Date/Time   COLORURINE AMBER (A) 01/21/2021 1724   APPEARANCEUR CLOUDY (A) 01/23/2021 1724   LABSPEC 1.015 01/18/2021 1724   PHURINE 5.0 02/08/2021 1724   GLUCOSEU NEGATIVE 01/24/2021 1724   HGBUR LARGE (A) 02/03/2021 1724   BILIRUBINUR NEGATIVE 01/23/2021 1724   KETONESUR 5 (A) 01/23/2021 1724   PROTEINUR 100 (A) 01/31/2021 1724   NITRITE NEGATIVE 01/20/2021 1724   LEUKOCYTESUR TRACE (A) 01/15/2021 1724   Lousie Calico N Robbie Rideaux  D.O. [facility]  If 7PM-7AM, please contact overnight-coverage provider If 7AM-7PM, please contact day coverage provider [email or web address]  02/05/2021, 6:50 PM

## 2021-02-09 NOTE — Progress Notes (Signed)
Pharmacy Antibiotic Note  Nicholas Pruitt is a 50 y.o. male admitted on 01/25/2021 with sepsis.  Pharmacy has been consulted for vancomycin and cefepime dosing.  WBC 22.3, LA >11.0 and no temperature obtained. Patient with multiorgan failure and SCr of 1.84 (BL SCr <1). Will dose vancomycin per levels.  Plan:  Dose vancomycin by levels per pharmacy Cefepime 2g IV q24h Monitor clinical picture, renal function, vanc levels if needed F/U C&S, abx de-escalation, LOT  Height: 5\' 5"  (165.1 cm) (per [facility] care everywhere 11/2020) IBW/kg (Calculated) : 61.5  No data recorded.  Recent Labs  Lab 02/08/2021 1724  WBC 22.3*  CREATININE 1.84*  LATICACIDVEN >11.0*    Estimated Creatinine Clearance: 27.4 mL/min (A) (by C-G formula based on SCr of 1.84 mg/dL (H)).    No Known Allergies  Antimicrobials this admission: vanc 2/27> Cefepime 2/27> Flagyl 2/27>  Microbiology results: 2/27 BCx sent 2/27 UCx sent   Brendolyn Patty, PharmD Clinical Pharmacist  01/27/2021   8:53 PM

## 2021-02-09 NOTE — ED Triage Notes (Addendum)
Patient presents to ER from home. Patient has metastatic melanoma. Patient was on hospice but moved and has not set up yet. Per family patient has had increased altered mental status today. Patient alert and talking. Disoriented x4. Patient pale and skin cool to touch. EMS states patient bgl 10 when they arrived to patients home. Patient started on D10 in route to ER.

## 2021-02-09 NOTE — Progress Notes (Signed)
PHARMACY -  BRIEF ANTIBIOTIC NOTE   Pharmacy has received consult(s) for vancomycin and cefepime from an ED provider.  The patient's profile has been reviewed for ht/wt/allergies/indication/available labs.    One time order(s) placed for vanc/cefepime  Further antibiotics/pharmacy consults should be ordered by admitting physician if indicated.                       Thank you,  Brendolyn Patty, PharmD Clinical Pharmacist  01/20/2021   6:42 PM

## 2021-02-09 NOTE — ED Notes (Signed)
Dr. Cox at bedside.  

## 2021-02-09 NOTE — ED Provider Notes (Addendum)
[facility] Emergency Department Provider Note   ____________________________________________   Event Date/Time   First MD Initiated Contact with Patient 02/07/2021 1659     (approximate)  I have reviewed the triage vital signs and the nursing notes.   HISTORY  Chief Complaint Altered Mental Status  EM caveat: Patient oriented only to self not to situation date or place, delirium or mental status  HPI Nicholas Pruitt is a 50 y.o. male here for evaluation of altered mental status  Per EMS they were called out for the patient has been weak not eating or drinking anything.  He has known cancer.     Past Medical History:  Diagnosis Date  . Metastatic melanoma [facility])     Patient Active Problem List   Diagnosis Date Noted  . Sepsis ([place]) 02/04/2021  . Nausea and vomiting 01/29/2021  . Metastatic melanoma ([place]) 01/08/2021  . Goals of care, counseling/discussion 01/08/2021  . Non-intractable vomiting 01/08/2021  . Weight loss 01/08/2021  . Neoplasm related pain 01/08/2021    No past surgical history on file.  Prior to Admission medications   Medication Sig Start Date End Date Taking? Authorizing Provider  Docusate Sodium (DSS) 100 MG CAPS Take 100 mg by mouth in the morning and at bedtime.   Yes [provider]  gabapentin (NEURONTIN) 100 MG capsule Take 100 mg by mouth 3 (three) times daily.   Yes [provider]  naloxone (NARCAN) nasal spray 4 mg/0.1 mL Place into the nose. Place 1 spray (4 mg total) into one nostril once as needed (if not breathing or overdose is suspected.) for up to 1 dose Give 2nd dose in 5-10 min if not responding or if sx return for up to 1 dose 10/21/20  Yes [provider]  ondansetron (ZOFRAN ODT) 8 MG disintegrating tablet Take 1 tablet (8 mg total) by mouth every 8 (eight) hours as needed for nausea or vomiting. 01/08/21  Yes Earlie Server, MD  oxyCODONE (OXYCONTIN) 20 mg 12 hr tablet Take 1 tablet (20 mg  total) by mouth every 12 (twelve) hours. 01/27/21  Yes Borders, Kirt Boys, NP  Oxycodone HCl 10 MG TABS Take 1 tablet (10 mg total) by mouth every 4 (four) hours as needed. 01/27/21  Yes Borders, Kirt Boys, NP  potassium chloride (KLOR-CON) 10 MEQ tablet Take 10 mEq by mouth 2 (two) times daily. 08/15/20  Yes [provider]  prochlorperazine (COMPAZINE) 10 MG tablet Take 10 mg by mouth every 6 (six) hours as needed for nausea.   Yes [provider]    Allergies Patient has no known allergies.  Family History  Problem Relation Age of Onset  . Breast cancer Mother   . Kidney cancer Mother     Social History Social History   Tobacco Use  . Smoking status: Current Every Day Smoker    Packs/day: 1.50    Years: 20.00    Pack years: 30.00    Types: Cigarettes  . Smokeless tobacco: Never Used  Vaping Use  . Vaping Use: Never used  Substance Use Topics  . Alcohol use: Not Currently  . Drug use: Yes    Types: Marijuana    Comment: smokes marijuana daily    Review of Systems   ____________________________________________   PHYSICAL EXAM:  VITAL SIGNS: ED Triage Vitals  Enc Vitals Group     BP 01/27/2021 1730 118/87     Pulse Rate 01/19/2021 1720 (!) 114     Resp 02/03/2021  1720 18     Temp --      Temp src --      SpO2 01/20/2021 1720 90 %     Weight --      Height --      Head Circumference --      Peak Flow --      Pain Score --      Pain Loc --      Pain Edu? --      Excl. in [place]? --     Constitutional: Alert and slightly somnolent, disoriented.  Thinks he is at a "fire house" does not know the date or time.  Patient's not able to answer even simple questions except with general answer he says "no" minimal mumble about nonsensical things Eyes: Conjunctivae are normal. Head: Atraumatic. Nose: No congestion/rhinnorhea. Mouth/Throat: Mucous membranes are bone dry Neck: No stridor.  Cardiovascular: Tachycardic rate, regular rhythm. Grossly normal heart  sounds.  Good peripheral circulation. Respiratory: Normal respiratory effort except for slight increased respiratory rate.  No retractions. Lungs CTAB. Gastrointestinal: Soft and nontender. No distention.  Across the patient's left chest wall he appears to have nodules as well as what appears to be a solid tumor under his left axilla. Musculoskeletal: No lower extremity tenderness nor edema.  He is disheveled in appearance.  Extremely cachectic. Neurologic: Disoriented mumbling at times.  Does not appear to be in obvious pain.  He does move extremities but is extremely weak in all of them.  There is no obvious focal deficits Skin:  Skin is somewhat cool, dry and intact. No rash noted. Psychiatric: Mood and affect are delirious  ____________________________________________   LABS (all labs ordered are listed, but only abnormal results are displayed)  Labs Reviewed  LACTIC ACID, PLASMA - Abnormal; Notable for the following components:      Result Value   Lactic Acid, Venous >11.0 (*)    All other components within normal limits  COMPREHENSIVE METABOLIC PANEL - Abnormal; Notable for the following components:   Sodium 132 (*)    Chloride 90 (*)    CO2 8 (*)    Glucose, Bld 204 (*)    BUN 95 (*)    Creatinine, Ser 1.84 (*)    Total Protein 5.9 (*)    Albumin 2.5 (*)    AST 590 (*)    ALT 256 (*)    Alkaline Phosphatase 469 (*)    Total Bilirubin 2.7 (*)    GFR, Estimated 45 (*)    Anion gap 34 (*)    All other components within normal limits  CBC WITH DIFFERENTIAL/PLATELET - Abnormal; Notable for the following components:   WBC 22.3 (*)    RBC 3.39 (*)    Hemoglobin 8.0 (*)    HCT 27.1 (*)    MCV 79.9 (*)    MCH 23.6 (*)    MCHC 29.5 (*)    RDW 15.9 (*)    Platelets 8 (*)    nRBC 0.7 (*)    Neutro Abs 17.5 (*)    Abs Immature Granulocytes 2.13 (*)    All other components within normal limits  PROTIME-INR - Abnormal; Notable for the following components:   Prothrombin Time  22.6 (*)    INR 2.1 (*)    All other components within normal limits  APTT - Abnormal; Notable for the following components:   aPTT 39 (*)    All other components within normal limits  URINALYSIS, COMPLETE (UACMP) WITH MICROSCOPIC -  Abnormal; Notable for the following components:   Color, Urine AMBER (*)    APPearance CLOUDY (*)    Hgb urine dipstick LARGE (*)    Ketones, ur 5 (*)    Protein, ur 100 (*)    Leukocytes,Ua TRACE (*)    RBC / HPF >50 (*)    Bacteria, UA FEW (*)    All other components within normal limits  AMMONIA - Abnormal; Notable for the following components:   Ammonia 42 (*)    All other components within normal limits  CBG MONITORING, ED - Abnormal; Notable for the following components:   Glucose-Capillary 108 (*)    All other components within normal limits  CULTURE, BLOOD (SINGLE)  URINE CULTURE  CULTURE, BLOOD (SINGLE)  LACTIC ACID, PLASMA  PROCALCITONIN  HIV ANTIBODY (ROUTINE TESTING W REFLEX)  CBG MONITORING, ED  CBG MONITORING, ED   ____________________________________________  EKG  Reviewed inter by me at 1732 Heart rate 120 QRS 100 QTc 500 Sinus tachycardia, prolonged QT, nonspecific T wave abnormalities ____________________________________________  RADIOLOGY  CT Head Wo Contrast  Result Date: 01/14/2021 CLINICAL DATA:  Delirium EXAM: CT HEAD WITHOUT CONTRAST TECHNIQUE: Contiguous axial images were obtained from the base of the skull through the vertex without intravenous contrast. COMPARISON:  None. FINDINGS: Brain: No evidence of large-territorial acute infarction. No parenchymal hemorrhage. No mass lesion. No extra-axial collection. No mass effect or midline shift. No hydrocephalus. Basilar cisterns are patent. Vascular: No hyperdense vessel. Skull: No acute fracture or focal lesion. Sinuses/Orbits: Paranasal sinuses and mastoid air cells are clear. The orbits are unremarkable. Other: None. IMPRESSION: No acute intracranial abnormality.  Electronically Signed   By: Iven Finn M.D.   On: 02/03/2021 18:10   DG Chest Port 1 View  Result Date: 01/14/2021 CLINICAL DATA:  Questionable sepsis.  History of metastatic melanoma EXAM: PORTABLE CHEST 1 VIEW COMPARISON:  October 30, 2020 FINDINGS: The cardiomediastinal silhouette is normal in contour. No pleural effusion. No pneumothorax. No acute pleuroparenchymal abnormality. Visualized abdomen is unremarkable. Marked soft tissue expansion of the LEFT axilla consistent with known metastatic melanoma. No acute osseous abnormality. IMPRESSION: 1. No acute cardiopulmonary abnormality. 2. Marked soft tissue expansion of the LEFT axilla consistent with known metastatic melanoma. Electronically Signed   By: Valentino Saxon MD   On: 01/21/2021 17:32    Chest x-ray negative for acute findings.  Persistent left axillary mass, CT head negative for acute ____________________________________________   PROCEDURES  Procedure(s) performed: None  Procedures  Critical Care performed: Yes, see critical care note(s)  CRITICAL CARE Performed by: Delman Kitten   Total critical care time: 35 minutes  Critical care time was exclusive of separately billable procedures and treating other patients.  Critical care was necessary to treat or prevent imminent or life-threatening deterioration.  Critical care was time spent personally by me on the following activities: development of treatment plan with patient and/or surrogate as well as nursing, discussions with consultants, evaluation of patient's response to treatment, examination of patient, obtaining history from patient or surrogate, ordering and performing treatments and interventions, ordering and review of laboratory studies, ordering and review of radiographic studies, pulse oximetry and re-evaluation of patient's condition.  ____________________________________________   INITIAL IMPRESSION / ASSESSMENT AND PLAN / ED COURSE  Pertinent  labs & imaging results that were available during my care of the patient were reviewed by me and considered in my medical decision making (see chart for details).   Patient appears to be in shock.  He also  has delirium, tachycardia tachypnea.  Lab work reviewed concerning for shock, DIC endorgan failure AKI shock liver etc.  Overall I suspect the patient's prognosis is extremely critical, he may very well be moving towards end-of-life.  Have discussed with his brother who is his family that was able to be contacted lives with him and reports the patient would wish to be DO NOT RESUSCITATE.  Clinical Course as of 01/26/2021 1945  Nancy Fetter Feb 09, 2021  1813 Spoke with the patient's brother Eddie Dibbles.  He advises that he knows his brother is very ill from cancer.  He lives and cares with him.  He advises the patient has been discussing and wanting to get a DO NOT RESUSCITATE but not been able to obtain this yet as needed certification.  Brother asked that we make the patient DO NOT RESUSCITATE and that he primarily provides care for him.  Parents are still surviving brothers reaching out to them, but brother understanding criticality of the patient's presentation asked that we make him DO NOT RESUSCITATE and given the patient's presentation, known metastatic melanoma, and his current clinical status I think this is more than reasonable. [MQ]  1820 Attempted to conact mother and father (ron and sue), unable to reach. Busy tone on all attempts. I have been able to communicate with Eddie Dibbles (brother) who cares for his brother daily and has been involved in his medical care. [MQ]  1610 Labs reviewed, seem most indicative of likely sepsis possibly from UTI and endorgan damage.  Overall I suspect the patient may be actively dying and if critically ill. [MQ]  9604 Labs continue to show critical status with endorgan failure.  Also concerned DIC may be existing.  Severe thrombocytopenia [MQ]  [place], reports he just got  off phone with patient's son (who is 60 years old) and understands his father is critical ill and near death and being hospitalized. Eddie Dibbles reports his daughter is not usually easily reached and having social issues. Son [phone] Edison Nasuti).  [MQ]  1944 WBC Morphology: TOXIC GRANULATION [MQ]  1944 Smear Review: PLATELETS APPEAR DECREASED [MQ]    Clinical Course User Index [MQ] Delman Kitten, MD   Admitted to hospitalist Dr. Tobie Poet  ED Sepsis - Repeat Assessment   Performed at:   730p  Last Vitals:    Blood pressure 114/90, pulse (!) 113, resp. rate 20, SpO2 94 %.  Heart:      Tachycardia  Lungs:     Mild tachypnea improved  Capillary Refill:   Normal less than 2 seconds  Peripheral Pulse (include location): Rate rate   Skin (include color):   Cool to touch   ____________________________________________   FINAL CLINICAL IMPRESSION(S) / ED DIAGNOSES  Final diagnoses:  Urinary tract infection, acute  Shock ([place])  Sepsis, due to unspecified organism, unspecified whether acute organ dysfunction present [facility])        Note:  This document was prepared using Dragon voice recognition software and may include unintentional dictation errors       Delman Kitten, MD 02/04/2021 1938    Called for patient's son Edison Nasuti) at number given by brother Eddie Dibbles. Voicemail full.  Unable to contact. Eddie Dibbles advised patient's daughter not reachable at this poitn.   Delman Kitten, MD 01/31/2021 [phone]

## 2021-02-09 NOTE — ED Notes (Signed)
BGL 84

## 2021-02-09 NOTE — Progress Notes (Signed)
CODE SEPSIS - PHARMACY COMMUNICATION  **Broad Spectrum Antibiotics should be administered within 1 hour of Sepsis diagnosis**  Time Code Sepsis Called/Page Received: 1813  Antibiotics Ordered: 1806  Time of 1st antibiotic administration: 1835  Additional action taken by pharmacy: none  If necessary, Name of Provider/Nurse Contacted: [place] ,PharmD Clinical Pharmacist  01/21/2021  6:37 PM

## 2021-02-09 NOTE — ED Notes (Signed)
Dr. Tobie Poet aware oral temp unable to be obtained, she states rectal temp is not needed at this time.

## 2021-02-10 ENCOUNTER — Inpatient Hospital Stay: Payer: 59 | Admitting: Hospice and Palliative Medicine

## 2021-02-10 ENCOUNTER — Ambulatory Visit: Payer: 59

## 2021-02-10 DIAGNOSIS — E872 Acidosis: Secondary | ICD-10-CM

## 2021-02-10 DIAGNOSIS — K7200 Acute and subacute hepatic failure without coma: Secondary | ICD-10-CM

## 2021-02-10 DIAGNOSIS — C799 Secondary malignant neoplasm of unspecified site: Secondary | ICD-10-CM | POA: Diagnosis not present

## 2021-02-10 DIAGNOSIS — R64 Cachexia: Secondary | ICD-10-CM

## 2021-02-10 DIAGNOSIS — Z7189 Other specified counseling: Secondary | ICD-10-CM

## 2021-02-10 DIAGNOSIS — R6520 Severe sepsis without septic shock: Secondary | ICD-10-CM

## 2021-02-10 DIAGNOSIS — D696 Thrombocytopenia, unspecified: Secondary | ICD-10-CM

## 2021-02-10 DIAGNOSIS — A419 Sepsis, unspecified organism: Secondary | ICD-10-CM | POA: Diagnosis not present

## 2021-02-10 DIAGNOSIS — D62 Acute posthemorrhagic anemia: Secondary | ICD-10-CM

## 2021-02-10 DIAGNOSIS — Z515 Encounter for palliative care: Secondary | ICD-10-CM

## 2021-02-10 DIAGNOSIS — R4182 Altered mental status, unspecified: Secondary | ICD-10-CM

## 2021-02-10 DIAGNOSIS — R579 Shock, unspecified: Secondary | ICD-10-CM | POA: Diagnosis not present

## 2021-02-10 DIAGNOSIS — D689 Coagulation defect, unspecified: Secondary | ICD-10-CM | POA: Diagnosis not present

## 2021-02-10 DIAGNOSIS — N179 Acute kidney failure, unspecified: Secondary | ICD-10-CM

## 2021-02-10 LAB — COMPREHENSIVE METABOLIC PANEL
ALT: 344 U/L — ABNORMAL HIGH (ref 0–44)
AST: 1006 U/L — ABNORMAL HIGH (ref 15–41)
Albumin: 2.1 g/dL — ABNORMAL LOW (ref 3.5–5.0)
Alkaline Phosphatase: 413 U/L — ABNORMAL HIGH (ref 38–126)
Anion gap: 20 — ABNORMAL HIGH (ref 5–15)
BUN: 99 mg/dL — ABNORMAL HIGH (ref 6–20)
CO2: 20 mmol/L — ABNORMAL LOW (ref 22–32)
Calcium: 8.4 mg/dL — ABNORMAL LOW (ref 8.9–10.3)
Chloride: 95 mmol/L — ABNORMAL LOW (ref 98–111)
Creatinine, Ser: 1.37 mg/dL — ABNORMAL HIGH (ref 0.61–1.24)
GFR, Estimated: >60 mL/min (ref >60)
Glucose, Bld: 144 mg/dL — ABNORMAL HIGH (ref 70–99)
Potassium: 3.9 mmol/L (ref 3.5–5.1)
Sodium: 135 mmol/L (ref 135–145)
Total Bilirubin: 2.5 mg/dL — ABNORMAL HIGH (ref 0.3–1.2)
Total Protein: 4.9 g/dL — ABNORMAL LOW (ref 6.5–8.1)

## 2021-02-10 LAB — MAGNESIUM: Magnesium: 2.1 mg/dL (ref 1.7–2.4)

## 2021-02-10 LAB — GLUCOSE, CAPILLARY
Glucose-Capillary: 102 mg/dL — ABNORMAL HIGH (ref 70–99)
Glucose-Capillary: 109 mg/dL — ABNORMAL HIGH (ref 70–99)
Glucose-Capillary: 122 mg/dL — ABNORMAL HIGH (ref 70–99)
Glucose-Capillary: 124 mg/dL — ABNORMAL HIGH (ref 70–99)
Glucose-Capillary: 144 mg/dL — ABNORMAL HIGH (ref 70–99)
Glucose-Capillary: 41 mg/dL — CL (ref 70–99)

## 2021-02-10 LAB — PHOSPHORUS: Phosphorus: 4.8 mg/dL — ABNORMAL HIGH (ref 2.5–4.6)

## 2021-02-10 LAB — IMMATURE PLATELET FRACTION: Immature Platelet Fraction: 14 % — ABNORMAL HIGH (ref 1.2–8.6)

## 2021-02-10 LAB — PROCALCITONIN: Procalcitonin: 21.9 ng/mL

## 2021-02-10 LAB — CBC
HCT: 19.1 % — ABNORMAL LOW (ref 39.0–52.0)
Hemoglobin: 6.1 g/dL — ABNORMAL LOW (ref 13.0–17.0)
MCH: 23.2 pg — ABNORMAL LOW (ref 26.0–34.0)
MCHC: 31.9 g/dL (ref 30.0–36.0)
MCV: 72.6 fL — ABNORMAL LOW (ref 80.0–100.0)
Platelets: 7 10*3/uL — CL (ref 150–400)
RBC: 2.63 MIL/uL — ABNORMAL LOW (ref 4.22–5.81)
RDW: 15.6 % — ABNORMAL HIGH (ref 11.5–15.5)
WBC: 8.8 10*3/uL (ref 4.0–10.5)
nRBC: 1 % — ABNORMAL HIGH (ref 0.0–0.2)

## 2021-02-10 LAB — LACTIC ACID, PLASMA
Lactic Acid, Venous: 7.5 mmol/L (ref 0.5–1.9)
Lactic Acid, Venous: 7.6 mmol/L (ref 0.5–1.9)

## 2021-02-10 MED ORDER — ONDANSETRON 4 MG PO TBDP
4.0000 mg | ORAL_TABLET | Freq: Four times a day (QID) | ORAL | Status: DC | PRN
  Filled 2021-02-10: qty 1

## 2021-02-10 MED ORDER — ONDANSETRON HCL 4 MG/2ML IJ SOLN: 4.0000 mg | Freq: Four times a day (QID) | INTRAMUSCULAR | Status: DC | PRN

## 2021-02-10 MED ORDER — SODIUM CHLORIDE 0.9 % IV SOLN: INTRAVENOUS | Status: DC | PRN

## 2021-02-10 MED ORDER — DEXTROSE 50 % IV SOLN
1.0000 | INTRAVENOUS | Status: AC
  Administered 2021-02-10: 50 mL via INTRAVENOUS

## 2021-02-10 MED ORDER — GLYCOPYRROLATE 0.2 MG/ML IJ SOLN
0.2000 mg | INTRAMUSCULAR | Status: DC | PRN
Start: 2021-02-10 — End: 2021-02-11
  Filled 2021-02-10: qty 1

## 2021-02-10 MED ORDER — GLYCOPYRROLATE 1 MG PO TABS
1.0000 mg | ORAL_TABLET | ORAL | Status: DC | PRN
  Filled 2021-02-10: qty 1

## 2021-02-10 MED ORDER — SODIUM CHLORIDE 0.9 % IV SOLN
2.0000 g | Freq: Two times a day (BID) | INTRAVENOUS | Status: DC
  Administered 2021-02-10: 2 g via INTRAVENOUS
  Filled 2021-02-10 (×2): qty 2

## 2021-02-10 MED ORDER — HALOPERIDOL LACTATE 2 MG/ML PO CONC
0.5000 mg | ORAL | Status: DC | PRN
  Filled 2021-02-10: qty 0.3

## 2021-02-10 MED ORDER — HALOPERIDOL 0.5 MG PO TABS
0.5000 mg | ORAL_TABLET | ORAL | Status: DC | PRN
  Filled 2021-02-10: qty 1

## 2021-02-10 MED ORDER — GLYCOPYRROLATE 0.2 MG/ML IJ SOLN
0.2000 mg | INTRAMUSCULAR | Status: DC | PRN
  Filled 2021-02-10: qty 1

## 2021-02-10 MED ORDER — DEXTROSE 50 % IV SOLN: 25.0000 g | INTRAVENOUS | Status: DC

## 2021-02-10 MED ORDER — HALOPERIDOL LACTATE 5 MG/ML IJ SOLN: 0.5000 mg | INTRAMUSCULAR | Status: DC | PRN

## 2021-02-10 NOTE — Progress Notes (Signed)
ICU Rn [place] came to bedside to assess patient. MD Zhang also at bedside during this time. MD aware of RED mews and patient status. Zhang to speak with brother about patient being changed to comfort care today. MD said no rapid needed to be called at this time,.

## 2021-02-10 NOTE — Consult Note (Signed)
Hematology/Oncology Consult note [facility] [phone] [phone]  Patient Care Team: Franklyn Lor, MD as PCP - General (Internal Medicine) Patient, No Pcp Per (General Practice)   Name of the patient: Nicholas Pruitt  [id number]  22-Apr-1971   Date of visit: 02/10/21 REASON FOR COSULTATION:  Metastatic melanoma  History of presenting illness-  50 y.o. male with PMH listed at below who was sent  to ER for evaluation of altered mental status.  Patient is confused and not able to provide history.  There is report of patient not eating or drinking anything.  Patient is known to oncology service for metastatic melanoma, recently underwent palliative RT to his left axillary melanoma mass and one dose of Nivolumab. He missed his follow up appointment with me last week.   Initial blood work showed severe thrombocytopenia of 8000, Hb of 6.1.  Acute kidney failure with Creatinine of 1.84, alkaline phosphate 413, AST 590, ALT 256, bilirubin 2.7. elevated lactate acid, blood culture is pending He is admitted for sepsis, AKI, hepatitis, altered mental status, thrombocytopenia.  CT head negative for acute process. S/p 1 unit of platelet transfusion.  Oncology and Palliative care service were consulted for evaluation and management.   Patient can not provide any history due to altered mental status. Ex wife and his son Edison Nasuti are at bedside.   Review of Systems  Unable to perform ROS: Mental status change    No Known Allergies  Patient Active Problem List   Diagnosis Date Noted  . Sepsis ([place]) 02/02/2021  . Nausea and vomiting 01/29/2021  . Metastatic melanoma ([place]) 01/08/2021  . Goals of care, counseling/discussion 01/08/2021  . Non-intractable vomiting 01/08/2021  . Weight loss 01/08/2021  . Neoplasm related pain 01/08/2021     Past Medical History:  Diagnosis Date  . Metastatic melanoma ([place])      No past surgical history on  file.  Social History   Socioeconomic History  . Marital status: Widowed    Spouse name: Not on file  . Number of children: Not on file  . Years of education: Not on file  . Highest education level: Not on file  Occupational History  . Occupation: disabled  Tobacco Use  . Smoking status: Current Every Day Smoker    Packs/day: 1.50    Years: 20.00    Pack years: 30.00    Types: Cigarettes  . Smokeless tobacco: Never Used  Vaping Use  . Vaping Use: Never used  Substance and Sexual Activity  . Alcohol use: Not Currently  . Drug use: Yes    Types: Marijuana    Comment: smokes marijuana daily  . Sexual activity: Not on file  Other Topics Concern  . Not on file  Social History Narrative   ** Merged History Encounter **       Social Determinants of Health   Financial Resource Strain: Not on file  Food Insecurity: Not on file  Transportation Needs: Not on file  Physical Activity: Not on file  Stress: Not on file  Social Connections: Not on file  Intimate Partner Violence: Not on file     Family History  Problem Relation Age of Onset  . Breast cancer Mother   . Kidney cancer Mother      Current Facility-Administered Medications:  .  0.9 %  sodium chloride infusion, , Intravenous, PRN, Sharen Hones, MD .  ceFEPIme (MAXIPIME) 2 g in sodium chloride 0.9 % 100 mL IVPB, 2 g, Intravenous, Q12H,  Oswald Hillock, [facility], Last Rate: 200 mL/hr at 02/10/21 1100, 2 g at 02/10/21 1100 .  fentaNYL (SUBLIMAZE) injection 25 mcg, 25 mcg, Intravenous, Q2H PRN, Sharion Settler, NP, 25 mcg at 02/10/21 0955 .  gabapentin (NEURONTIN) capsule 100 mg, 100 mg, Oral, TID, Cox, Amy N, DO, 100 mg at 02/10/21 0019 .  sodium bicarbonate 150 mEq in dextrose 5 % 1,000 mL infusion, , Intravenous, Continuous, Sharen Hones, MD, Last Rate: 100 mL/hr at 02/10/21 1059, New Bag at 02/10/21 1059 .  thiamine (B-1) injection 100 mg, 100 mg, Intravenous, Daily, Cox, Amy N, DO, 100 mg at 02/10/21  0024   Physical exam:  Vitals:   02/10/21 0508 02/10/21 0600 02/10/21 0829 02/10/21 1000  BP: 104/75 102/73 103/73 105/68  Pulse: (!) 135 (!) 135 (!) 133 (!) 133  Resp: 20 20 (!) 22 (!) 23  Temp: 98 F (36.7 C) 98 F (36.7 C) 97.6 F (36.4 C) (!) 97.5 F (36.4 C)  TempSrc: Axillary Axillary Oral Axillary  SpO2: 98% 98% 98% 97%  Weight:      Height:       Physical Exam Constitutional:      Comments: cachetic  HENT:     Mouth/Throat:     Mouth: Mucous membranes are dry.     Comments: Dried blood in mouth Cardiovascular:     Rate and Rhythm: Normal rate.  Pulmonary:     Effort: Pulmonary effort is normal.  Abdominal:     General: Abdomen is flat.  Musculoskeletal:        General: No swelling.  Skin:    Coloration: Skin is pale.  Neurological:     Comments: Confused, oriented x 0.          CMP Latest Ref Rng & Units 02/10/2021  Glucose 70 - 99 mg/dL 144(H)  BUN 6 - 20 mg/dL 99(H)  Creatinine 0.61 - 1.24 mg/dL 1.37(H)  Sodium 135 - 145 mmol/L 135  Potassium 3.5 - 5.1 mmol/L 3.9  Chloride 98 - 111 mmol/L 95(L)  CO2 22 - 32 mmol/L 20(L)  Calcium 8.9 - 10.3 mg/dL 8.4(L)  Total Protein 6.5 - 8.1 g/dL 4.9(L)  Total Bilirubin 0.3 - 1.2 mg/dL 2.5(H)  Alkaline Phos 38 - 126 U/L 413(H)  AST 15 - 41 U/L 1,006(H)  ALT 0 - 44 U/L 344(H)   CBC Latest Ref Rng & Units 02/10/2021  WBC 4.0 - 10.5 K/uL 8.8  Hemoglobin 13.0 - 17.0 g/dL 6.1(L)  Hematocrit 39.0 - 52.0 % 19.1(L)  Platelets 150 - 400 K/uL 7(LL)    RADIOGRAPHIC STUDIES: I have personally reviewed the radiological images as listed and agreed with the findings in the report. CT Head Wo Contrast  Result Date: 01/22/2021 CLINICAL DATA:  Delirium EXAM: CT HEAD WITHOUT CONTRAST TECHNIQUE: Contiguous axial images were obtained from the base of the skull through the vertex without intravenous contrast. COMPARISON:  None. FINDINGS: Brain: No evidence of large-territorial acute infarction. No parenchymal hemorrhage. No  mass lesion. No extra-axial collection. No mass effect or midline shift. No hydrocephalus. Basilar cisterns are patent. Vascular: No hyperdense vessel. Skull: No acute fracture or focal lesion. Sinuses/Orbits: Paranasal sinuses and mastoid air cells are clear. The orbits are unremarkable. Other: None. IMPRESSION: No acute intracranial abnormality. Electronically Signed   By: Iven Finn M.D.   On: 01/23/2021 18:10   DG Chest Port 1 View  Result Date: 01/26/2021 CLINICAL DATA:  Questionable sepsis.  History of metastatic melanoma EXAM: PORTABLE CHEST 1 VIEW COMPARISON:  October 30, 2020 FINDINGS: The cardiomediastinal silhouette is normal in contour. No pleural effusion. No pneumothorax. No acute pleuroparenchymal abnormality. Visualized abdomen is unremarkable. Marked soft tissue expansion of the LEFT axilla consistent with known metastatic melanoma. No acute osseous abnormality. IMPRESSION: 1. No acute cardiopulmonary abnormality. 2. Marked soft tissue expansion of the LEFT axilla consistent with known metastatic melanoma. Electronically Signed   By: Valentino Saxon MD   On: 01/17/2021 17:32    Assessment and plan-   Acute kidney failure, likely due to dehydration from poor oral intake.  Acute encephalopathy like due to acute liver failur/underlying infection/sepsis.. Increased ammonia level Acute hepatitis, probably due to hypotension shock from his dehydration, or immunotherapy toxicities.   Acute thrombocytopenia, platelet <10,000, s/p 1 unit of platelet transfusion. probaly due to recent radiation treatment, sepsis or platelet destruction in the peripheral. Check immature platelet fraction- increased, which indicate possible peripheral destruction- if he were for standard of care, would obtain DIC work up, which I think is likely the cause of low platelet.  Acute on chronic anemia, possible GI bleeding due to thrombocytopenia/coagulopathy.  Metastatic melanoma, poor prognosis.  recently  finished RT. He has only had one dose of Nivolumab, not enough time to determined treatment response. Sever protein calorie malnutrition, prognosis is very poor.  Patient's ex wife told me that patient;s family had talked about comfort care. I further discussed with patient's son Edison Nasuti who understands that patient's chance of getting better is very slim and most likely patient is at the end of his life. Palliative care has been consulted and I discussed with Vonna Kotyk. I think comfort care is very reasonable.    Thank you for allowing me to participate in the care of this patient.   Earlie Server, MD, PhD Hematology Oncology [facility] at [facility] Pager- [phone] 02/10/2021

## 2021-02-10 NOTE — Progress Notes (Signed)
   02/10/21 0829  Assess: MEWS Score  Temp 97.6 F (36.4 C)  BP 103/73  Pulse Rate (!) 133  Resp (!) 22  SpO2 98 %  O2 Device Room Air  Assess: MEWS Score  MEWS Temp 0  MEWS Systolic 0  MEWS Pulse 3  MEWS RR 1  MEWS LOC 0  MEWS Score 4  MEWS Score Color Red  Assess: if the MEWS score is Yellow or Red  Were vital signs taken at a resting state? Yes  Focused Assessment No change from prior assessment  Early Detection of Sepsis Score *See Row Information* High  MEWS guidelines implemented *See Row Information* No, previously red, continue vital signs every 4 hours  Treat  MEWS Interventions Administered prn meds/treatments;Escalated (See documentation below);Other (Comment) (contacted ICU RN for need of rapid and MD at beside; MD advised NO rapid at this time)  Take Vital Signs  Increase Vital Sign Frequency  Red: Q 1hr X 4 then Q 4hr X 4, if remains red, continue Q 4hrs  Escalate  MEWS: Escalate Red: discuss with charge nurse/RN and provider, consider discussing with RRT  Notify: Charge Nurse/RN  Name of Charge Nurse/RN Notified Britt Bolognese  Date Charge Nurse/RN Notified 02/10/21  Time Charge Nurse/RN Notified 0831  Notify: Provider  Provider Name/Title MD [facility] and ICU RN [place]  Date Provider Notified 02/10/21  Time Provider Notified [phone]  Notification Type Face-to-face  Notification Reason Other (Comment) (CHange on MEWS d/t HR and RR)  Provider response No new orders;At bedside  Date of Provider Response 02/10/21  Time of Provider Response [phone]  Notify: Rapid Response  Name of Rapid Response RN Notified  (MD advised no rapid needed at this time)  Document  Patient Outcome Other (Comment) (Continue vitals q1h, CBG collected and stable)  Progress note created (see row info) Yes

## 2021-02-10 NOTE — Progress Notes (Addendum)
MD Roosevelt Locks spoke with family at bedside about patient's status. Family decided to make patient comfort care at this time. Fluids were stopped by this RN as well as cardiac monitoring discontinued. Comfort cart ordered from nutritional services for family at bedside.   Patient's MEWS continued to be Red with last set of vitals signs obtained prior to change to comfort care. CN Britt Bolognese  was aware of the continued Red MEWS as well as MD.

## 2021-02-10 NOTE — Progress Notes (Addendum)
PROGRESS NOTE    Nicholas Pruitt  DGU:[id number] DOB: 1971/04/01 DOA: 02/06/2021 PCP: Nicholas Lor, MD   Chief complaint.  Altered mental status. Brief Narrative:  Nicholas Pruitt is a 50 y.o. male with medical history significant for Metastatic melanoma,  left axillary mass, presented to the emergency department from home for chief concerns of altered mentation and poor p.o. intake. Patient was previously and hospice care, not currently. Upon arriving the hospital, patient appear cachectic, very confused, he also appeared to have a rectal bleeding with Hb of 6.1.   Assessment & Plan:   Active Problems:   Metastatic melanoma ([place])   Sepsis ([place])  #1.  Metastatic melanoma. SIRS. Multiorgan failure. Severe protein calorie malnutrition with cachexia. Failure to thrive. In my opinion, patient is actively dying.  Called multiple times to patient brother, left a message. Patient treatment is futile at this time, prefer to transfer to comfort care after talking to the brother.  #2.  Acute blood loss anemia. Severe thrombocytopenia. Rectal bleeding. Patient has received platelet transfusion overnight.  He was a found to have a rectal bleeding.  Hemoglobin dropped down to 6.1. Based on my assessment, patient care is futile.  Rectal bleeding probably due to metastatic melanoma, not able to stop.  Patient will not be able to tolerate  Colonoscopy, not able to intervene with active bleeding. Will hold off any additional work-up and she will talk to the brother.  Hold off transfusion.  #3.  Severe lactic acidosis. Lactic acid level >11, currently 7.  This is probably due to widespread metastasis of melanoma.  #4.  Acute kidney injury with metabolic acidosis. Still on bicarb drip.  #5.  Acute metabolic encephalopathy. Patient is very confused.  6.  Hypoglycemia. Secondary to liver failure.  Patient is actively dying, will transfer to comfort care if we can get a hold of brother to  talk.  For now, continue IV fluids to maintain glucose level.  Addendum: 9563. Met patient brother in patient room, discussed patient care, patient has no chance for recovery.  Decision is made to go for comfort care.  Will discontinue all active treatment, patient can be transferred to hospice inpatient if a bed available  DVT prophylaxis: None Code Status: DNR Family Communication: as above Disposition Plan:  .   Status is: Inpatient  Remains inpatient appropriate because:Inpatient level of care appropriate due to severity of illness   Dispo: The patient is from: Home              Anticipated d/c is to: Uncertain              Patient currently is not medically stable to d/c.   Difficult to place patient No        I/O last 3 completed shifts: In: 150 [I.V.:150] Out: 200 [Urine:200] No intake/output data recorded.     Consultants:   Oncology  Procedures: None  Antimicrobials: None  Subjective: Patient is minimally responsive, cachectic, not able to answer any questions. Does not appear to have any respite distress.   Objective: Vitals:   02/10/21 0418 02/10/21 0508 02/10/21 0600 02/10/21 0829  BP: 97/74 104/75 102/73 103/73  Pulse: (!) 136 (!) 135 (!) 135 (!) 133  Resp: (!) _0 (!) 22  Temp: 98 F (36.7 C) 98 F (36.7 C) 98 F (36.7 C) 97.6 F (36.4 C)  TempSrc: Axillary Axillary Axillary Oral  SpO2: 98% 98% 98% 98%  Weight:      Height:  Intake/Output Summary (Last 24 hours) at 02/10/2021 0851 Last data filed at 02/10/2021 0032 Gross per 24 hour  Intake 150 ml  Output 200 ml  Net -50 ml   Filed Weights   02/02/2021 2131 02/10/21 0338  Weight: 37.5 kg 37.5 kg    Examination:  General exam: Ill-appearing, cachectic Respiratory system: Decreased breathing sounds without crackles or wheezes. Cardiovascular system: Regular and tachycardic, no murmurs. Gastrointestinal system: Abdomen is nondistended, soft and nontender. Central  nervous system: Drowsy and minimally responsive. Extremities: Severe muscle atrophy Skin: No rashes, lesions or ulcers     Data Reviewed: I have personally reviewed following labs and imaging studies  CBC: Recent Labs  Lab 01/17/2021 1724 02/10/21 0517  WBC 22.3* 8.8  NEUTROABS 17.5*  --   HGB 8.0* 6.1*  HCT 27.1* 19.1*  MCV 79.9* 72.6*  PLT 8* 7*   Basic Metabolic Panel: Recent Labs  Lab 01/21/2021 1724 02/10/21 0435  NA 132* 135  K 5.0 3.9  CL 90* 95*  CO2 8* 20*  GLUCOSE 204* 144*  BUN 95* 99*  CREATININE 1.84* 1.37*  CALCIUM 9.7 8.4*  MG  --  2.1  PHOS  --  4.8*   GFR: Estimated Creatinine Clearance: 35.4 mL/min (A) (by C-G formula based on SCr of 1.37 mg/dL (H)). Liver Function Tests: Recent Labs  Lab 01/31/2021 1724 02/10/21 0435  AST 590* 1,006*  ALT 256* 344*  ALKPHOS 469* 413*  BILITOT 2.7* 2.5*  PROT 5.9* 4.9*  ALBUMIN 2.5* 2.1*   No results for input(s): LIPASE, AMYLASE in the last 168 hours. Recent Labs  Lab 01/22/2021 1724  AMMONIA 42*   Coagulation Profile: Recent Labs  Lab 01/17/2021 1724  INR 2.1*   Cardiac Enzymes: No results for input(s): CKTOTAL, CKMB, CKMBINDEX, TROPONINI in the last 168 hours. BNP (last 3 results) No results for input(s): PROBNP in the last 8760 hours. HbA1C: No results for input(s): HGBA1C in the last 72 hours. CBG: Recent Labs  Lab 01/17/2021 1702 01/27/2021 1811 02/10/21 0016 02/10/21 0043 02/10/21 0416  GLUCAP 84 108* 41* 122* 144*   Lipid Profile: No results for input(s): CHOL, HDL, LDLCALC, TRIG, CHOLHDL, LDLDIRECT in the last 72 hours. Thyroid Function Tests: No results for input(s): TSH, T4TOTAL, FREET4, T3FREE, THYROIDAB in the last 72 hours. Anemia Panel: No results for input(s): VITAMINB12, FOLATE, FERRITIN, TIBC, IRON, RETICCTPCT in the last 72 hours. Sepsis Labs: Recent Labs  Lab 01/19/2021 1724 02/10/2021 2106 02/10/21 0435 02/10/21 0743  PROCALCITON  --   --  21.90  --   LATICACIDVEN >11.0*  >11.0* 7.5* 7.6*    Recent Results (from the past 240 hour(s))  Blood culture (routine single)     Status: None (Preliminary result)   Collection Time: 01/20/2021  5:24 PM   Specimen: BLOOD  Result Value Ref Range Status   Specimen Description BLOOD BLOOD LEFT FOREARM  Final   Special Requests   Final    BOTTLES DRAWN AEROBIC AND ANAEROBIC Blood Culture adequate volume   Culture   Final    NO GROWTH < 24 HOURS Performed at [facility], [address], [place], [place] [address]    Report Status PENDING  Incomplete  Culture, blood (single)     Status: None (Preliminary result)   Collection Time: 01/26/2021  5:24 PM   Specimen: BLOOD  Result Value Ref Range Status   Specimen Description BLOOD RIGHT [facility]  Final   Special Requests   Final    BOTTLES DRAWN AEROBIC  AND ANAEROBIC Blood Culture adequate volume   Culture   Final    NO GROWTH < 12 HOURS Performed at [facility], [address] [place], [place] [address]    Report Status PENDING  Incomplete         Radiology Studies: CT Head Wo Contrast  Result Date: 01/19/2021 CLINICAL DATA:  Delirium EXAM: CT HEAD WITHOUT CONTRAST TECHNIQUE: Contiguous axial images were obtained from the base of the skull through the vertex without intravenous contrast. COMPARISON:  None. FINDINGS: Brain: No evidence of large-territorial acute infarction. No parenchymal hemorrhage. No mass lesion. No extra-axial collection. No mass effect or midline shift. No hydrocephalus. Basilar cisterns are patent. Vascular: No hyperdense vessel. Skull: No acute fracture or focal lesion. Sinuses/Orbits: Paranasal sinuses and mastoid air cells are clear. The orbits are unremarkable. Other: None. IMPRESSION: No acute intracranial abnormality. Electronically Signed   By: Iven Finn M.D.   On: 02/04/2021 18:10   DG Chest Port 1 View  Result Date: 02/08/2021 CLINICAL DATA:  Questionable sepsis.  History of metastatic melanoma EXAM: PORTABLE CHEST  1 VIEW COMPARISON:  October 30, 2020 FINDINGS: The cardiomediastinal silhouette is normal in contour. No pleural effusion. No pneumothorax. No acute pleuroparenchymal abnormality. Visualized abdomen is unremarkable. Marked soft tissue expansion of the LEFT axilla consistent with known metastatic melanoma. No acute osseous abnormality. IMPRESSION: 1. No acute cardiopulmonary abnormality. 2. Marked soft tissue expansion of the LEFT axilla consistent with known metastatic melanoma. Electronically Signed   By: Valentino Saxon MD   On: 02/04/2021 17:32        Scheduled Meds: . sodium chloride   Intravenous Once  . gabapentin  100 mg Oral TID  . thiamine injection  100 mg Intravenous Daily   Continuous Infusions: . ceFEPime (MAXIPIME) IV    . sodium bicarbonate (isotonic) 150 mEq in D5W 1000 mL infusion 150 mL/hr at 02/10/21 0731     LOS: 1 day    Time spent: 35 minutes    Sharen Hones, MD [facility]   To contact the attending provider between 7A-7P or the covering provider during after hours 7P-7A, please log into the web site [email or web address] and access using universal [place] password for that web site. If you do not have the password, please call the hospital operator.  02/10/2021, 8:51 AM

## 2021-02-10 NOTE — Progress Notes (Signed)
Cross Cover Overnight switch IV fluids D5 with 3 amp HCO3 and some improvement in acidosis and lactic.   However he has significant transaminitis and suspect metastatic in nature but did not see documentation/imaging to support. Hgb now 6.1 and platelets down to 7 from 8.  Still unable to get in contact with the only number for a brother in epic- [phone] Damarcus Reggio - message left to contact hospital

## 2021-02-10 NOTE — Progress Notes (Addendum)
MEWS turned Red. CN Mammie Russian made aware. Informed me to contact ICU Charge RN Slaughters. ICU Charge on way to assess patient for need of Rapid being called. Patient also had a red MEWS overnight and the ICU RN assessed patient. I am unaware if rapid was called overnight for this patient. HR at 134 and Respirations 22.

## 2021-02-10 NOTE — Progress Notes (Signed)
Called by bedside RN to assess patient due to red MEWS score.  Updated that patient has metastatic cancer- and a DNR- Dr. Roosevelt Locks at bedside.  He stated that he would contact the family to transition patient to comfort care. No other needs at this time.

## 2021-02-10 NOTE — Consult Note (Signed)
Nicholas Pruitt  Telephone:([phone] [phone]   Name: Nicholas Pruitt Date: 02/10/2021 MRN: [id number]  DOB: 15-Apr-1971  Patient Care Team: Franklyn Lor, MD as PCP - General (Internal Medicine) Patient, No Pcp Per (General Practice)    REASON FOR CONSULTATION: Nicholas Pruitt is a 50 y.o. male with multiple medical problems including metastatic melanoma extensively involving the left axilla status post XRT and chemo most recently on treatment with Nivolumab + Ipilimumab.  Patient has had intractable pain.    He has had chronically poor oral intake with persistent weight loss.  Patient was admitted to the hospital on 01/15/2021 with altered mental status.  He was found to have multiorgan failure with progressive anemia and thrombocytopenia in addition to rectal bleeding. He is now referred to palliative care to help address goals and manage ongoing symptoms.   SOCIAL HISTORY:     reports that he has been smoking cigarettes. He has a 30.00 pack-year smoking history. He has never used smokeless tobacco. He reports previous alcohol use. He reports current drug use. Drug: Marijuana.  Patient is a widower.  He lives at home with his twin brother. He has two adult children.  Patient formally worked in a [employer].  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  ADVANCE DIRECTIVES:  None on file  CODE STATUS: DNR  PAST MEDICAL HISTORY: Past Medical History:  Diagnosis Date  . Metastatic melanoma ([place])     PAST SURGICAL HISTORY: No past surgical history on file.  HEMATOLOGY/ONCOLOGY HISTORY:  Oncology History  Metastatic melanoma ([place])  01/08/2021 Initial Diagnosis   Metastatic melanoma ([place])   01/08/2021 Cancer Staging   Staging form: Melanoma of the Skin, AJCC 8th Edition - Clinical stage from 01/08/2021: Stage IV (ycTX, cNX, cM1) - Signed by Earlie Server, MD on 01/08/2021   01/29/2021 -   Chemotherapy    Patient is on Treatment Plan: MELANOMA NIVOLUMAB + IPILIMUMAB (1/3) Q21D / NIVOLUMAB Q14D        ALLERGIES:  has No Known Allergies.  MEDICATIONS:  Current Facility-Administered Medications  Medication Dose Route Frequency Provider Last Rate Last Admin  . 0.9 %  sodium chloride infusion   Intravenous PRN Sharen Hones, MD      . ceFEPIme (MAXIPIME) 2 g in sodium chloride 0.9 % 100 mL IVPB  2 g Intravenous Q12H Oswald Hillock, RPH 200 mL/hr at 02/10/21 1100 2 g at 02/10/21 1100  . fentaNYL (SUBLIMAZE) injection 25 mcg  25 mcg Intravenous Q2H PRN Sharion Settler, NP   25 mcg at 02/10/21 0955  . gabapentin (NEURONTIN) capsule 100 mg  100 mg Oral TID Cox, Amy N, DO   100 mg at 02/10/21 0019  . sodium bicarbonate 150 mEq in dextrose 5 % 1,000 mL infusion   Intravenous Continuous Sharen Hones, MD 100 mL/hr at 02/10/21 1059 New Bag at 02/10/21 1059  . thiamine (B-1) injection 100 mg  100 mg Intravenous Daily Cox, Amy N, DO   100 mg at 02/10/21 0024    VITAL SIGNS: BP 113/79   Pulse (!) 136   Temp 98.2 F (36.8 C)   Resp (!) 22   Ht 5' 5"  (1.651 m)   Wt 82 lb 10.8 oz (37.5 kg)   SpO2 97%   BMI 13.76 kg/m  Filed Weights   01/19/2021 2131 02/10/21 0338  Weight: 82 lb 10.8 oz (37.5 kg) 82 lb 10.8 oz (37.5  kg)    Estimated body mass index is 13.76 kg/m as calculated from the following:   Height as of this encounter: 5' 5"  (1.651 m).   Weight as of this encounter: 82 lb 10.8 oz (37.5 kg).  LABS: CBC:    Component Value Date/Time   WBC 8.8 02/10/2021 0517   HGB 6.1 (L) 02/10/2021 0517   HCT 19.1 (L) 02/10/2021 0517   PLT 7 (LL) 02/10/2021 0517   MCV 72.6 (L) 02/10/2021 0517   NEUTROABS 17.5 (H) 01/30/2021 1724   LYMPHSABS 1.6 01/17/2021 1724   MONOABS 0.8 01/31/2021 1724   EOSABS 0.3 01/18/2021 1724   BASOSABS 0.1 01/19/2021 1724   Comprehensive Metabolic Panel:    Component Value Date/Time   NA 135 02/10/2021 0435   K 3.9 02/10/2021 0435   CL 95 (L)  02/10/2021 0435   CO2 20 (L) 02/10/2021 0435   BUN 99 (H) 02/10/2021 0435   CREATININE 1.37 (H) 02/10/2021 0435   GLUCOSE 144 (H) 02/10/2021 0435   CALCIUM 8.4 (L) 02/10/2021 0435   AST 1,006 (H) 02/10/2021 0435   ALT 344 (H) 02/10/2021 0435   ALKPHOS 413 (H) 02/10/2021 0435   BILITOT 2.5 (H) 02/10/2021 0435   PROT 4.9 (L) 02/10/2021 0435   ALBUMIN 2.1 (L) 02/10/2021 0435    RADIOGRAPHIC STUDIES: CT Head Wo Contrast  Result Date: 01/28/2021 CLINICAL DATA:  Delirium EXAM: CT HEAD WITHOUT CONTRAST TECHNIQUE: Contiguous axial images were obtained from the base of the skull through the vertex without intravenous contrast. COMPARISON:  None. FINDINGS: Brain: No evidence of large-territorial acute infarction. No parenchymal hemorrhage. No mass lesion. No extra-axial collection. No mass effect or midline shift. No hydrocephalus. Basilar cisterns are patent. Vascular: No hyperdense vessel. Skull: No acute fracture or focal lesion. Sinuses/Orbits: Paranasal sinuses and mastoid air cells are clear. The orbits are unremarkable. Other: None. IMPRESSION: No acute intracranial abnormality. Electronically Signed   By: Iven Finn M.D.   On: 01/28/2021 18:10   DG Chest Port 1 View  Result Date: 01/26/2021 CLINICAL DATA:  Questionable sepsis.  History of metastatic melanoma EXAM: PORTABLE CHEST 1 VIEW COMPARISON:  October 30, 2020 FINDINGS: The cardiomediastinal silhouette is normal in contour. No pleural effusion. No pneumothorax. No acute pleuroparenchymal abnormality. Visualized abdomen is unremarkable. Marked soft tissue expansion of the LEFT axilla consistent with known metastatic melanoma. No acute osseous abnormality. IMPRESSION: 1. No acute cardiopulmonary abnormality. 2. Marked soft tissue expansion of the LEFT axilla consistent with known metastatic melanoma. Electronically Signed   By: Valentino Saxon MD   On: 01/25/2021 17:32    PERFORMANCE STATUS (ECOG) : 4 - Bedbound  Review of  Systems Unable to provide  Physical Exam General: Critically ill-appearing, severely cachectic Pulmonary: Unlabored Extremities: no edema, no joint deformities Skin: no rashes Neurological: Wakes with stimulation but does not follow commands  IMPRESSION: Patient is critically ill-appearing in setting of multiorgan failure.  He is severely cachectic.  I agree that patient appears to be rapidly approaching end-of-life.  I met with patient's son and ex-wife.  Together, we reviewed patient's current medical problems.  Son seems to recognize that patient's prognosis is grave.  I do not envision a path to meaningful recovery given multiorgan failure in setting of terminal cancer.  We discussed the option of comfort care in detail.  Son says it is leaning towards just keeping patient comfortable.  However, son would like to speak with his uncle (patient's brother) prior to making any decisions.  Patient's brother  is reportedly in route to the hospital.  PLAN: -Family is talking about decision-making -Will be happy to meet with patient's brother when he arrives to the hospital  Case and plan discussed with Dr. Tasia Catchings  Time Total: 75 minutes  Visit consisted of counseling and education dealing with the complex and emotionally intense issues of symptom management and palliative care in the setting of serious and potentially life-threatening illness.Greater than 50%  of this time was spent counseling and coordinating care related to the above assessment and plan.  Signed by: Altha Harm, PhD, NP-C

## 2021-02-10 NOTE — Plan of Care (Signed)
Pt admitted from ED DNR former hospice patient. Pending palliative consult this AM. Pt confused as baseline, frail appearing. Pt Q turn x2hrs. Pt incontinent of bowel and bladder. Pt treated for hypoglycemic episode x1. Pt with no s/s of pain per PNAID scale. Pt kept safe using bed alarm and frequent observation and vital signs. NAD this shift. See frequent vitals and assessment. Rachael Fee NP kept in contact due to fluctuation in MEWS from Red to Yellow. Due to poor prognosis no rapid response called, continued to monitor vitals and followed plan of care as outlined by provider. Labs obtained. Unable to complete admission d/t pt only orienting to self. No family with pt on arrival to floor. Pt due to be transfused platelets due to low platelets but provider unable to obtain consent, non-emergent situation, no active bleeding observed, and pt chronically low platelets, per Randol Kern NP.

## 2021-02-11 ENCOUNTER — Ambulatory Visit: Payer: 59

## 2021-02-11 LAB — URINE CULTURE: Culture: NO GROWTH

## 2021-02-11 DEATH — deceased

## 2021-02-12 ENCOUNTER — Ambulatory Visit: Payer: 59

## 2021-02-12 LAB — PREPARE PLATELET PHERESIS: Unit division: 0

## 2021-02-12 LAB — BPAM PLATELET PHERESIS
Blood Product Expiration Date: 202203012359
Unit Type and Rh: 5100

## 2021-02-13 ENCOUNTER — Ambulatory Visit: Payer: 59

## 2021-02-14 ENCOUNTER — Ambulatory Visit: Payer: 59

## 2021-02-14 LAB — CULTURE, BLOOD (SINGLE)
Culture: NO GROWTH
Culture: NO GROWTH
Special Requests: ADEQUATE
Special Requests: ADEQUATE

## 2021-02-17 ENCOUNTER — Ambulatory Visit: Payer: 59

## 2021-02-18 ENCOUNTER — Ambulatory Visit: Payer: 59

## 2021-02-19 ENCOUNTER — Ambulatory Visit: Payer: 59 | Admitting: Oncology

## 2021-02-19 ENCOUNTER — Ambulatory Visit: Payer: 59

## 2021-02-19 ENCOUNTER — Other Ambulatory Visit: Payer: 59

## 2021-02-20 ENCOUNTER — Other Ambulatory Visit: Payer: Medicaid Other | Admitting: Nurse Practitioner

## 2021-02-20 ENCOUNTER — Ambulatory Visit: Payer: 59

## 2021-02-21 ENCOUNTER — Ambulatory Visit: Payer: 59

## 2021-02-24 ENCOUNTER — Ambulatory Visit: Payer: 59

## 2021-02-25 ENCOUNTER — Ambulatory Visit: Payer: 59

## 2021-03-14 NOTE — Progress Notes (Addendum)
Went to check on patient, pt with no RR auscultated, no apical heartbeat auscultated and no movement observed. RN pronounced with Agricultural consultant Amy. Immediately notified B. Randol Kern NP as pt was on comfort care. Pt was kept comfortable throughout shift and pain medication administered when indicated, see Pain scale. Postmortem care performed. Pt with small liquid tarry stool. Transport cloth linens applied to pt. Brother Eddie Dibbles called and updated and made aware pt was able to be viewed in room. Brother stated that he was on his way. Agricultural consultant and B. Randol Kern NP made aware.   0700; Notified Attending Dr. Roosevelt Locks that pt expired this AM at 6:11. I pronounced pt as RN per order. [place] was notified and declined any organs or tissue. I notified brother Eddie Dibbles who is on the way know to view the pt in room.

## 2021-03-14 NOTE — Death Summary Note (Signed)
DEATH SUMMARY   Patient Details  Name: Nicholas Pruitt MRN: [id number] DOB: 08/22/1971  Admission/Discharge Information   Admit Date:  13-Feb-2021  Date of Death: Date of Death: 02/15/21  Time of Death: Time of Death: 0611  Length of Stay: 2  Referring Physician: Franklyn Lor, MD   Reason(s) for Hospitalization  Metastatic melanoma  Diagnoses  Preliminary cause of death:  Secondary Diagnoses (including complications and co-morbidities):  Active Problems:   Metastatic melanoma ([place])   Sepsis ([place])   Palliative care encounter   Shock ([place])   Coagulopathy ([place])   Shock liver   Thrombocytopenia ([facility])   Acute renal failure ([facility])   Altered mental status Multiorgan failure. Severe protein calorie malnutrition with cachexia. Failure to thrive. Acute blood loss anemia secondary to rectal bleeding Rectal bleeding. Severe thrombocytopenia. Lactic acidosis Acute metabolic encephalopathy. Hypoglycemia secondary to liver failure.  Brief Hospital Course (including significant findings, care, treatment, and services provided and events leading to death)  Nicholas Pruitt a 50 y.o.malewith medical history significant forMetastatic melanoma, left axillary mass, presented to the emergency department from home for chief concerns of altered mentation and poor p.o. intake. Patient was previously and hospice care, not currently. Upon arriving the hospital, patient appear cachectic, very confused, he also appeared to have a rectal bleeding with Hb of 6.1. Patient had multiorgan failure, after discussion with patient POA, his brother, decision was made to go with comfort care.  Patient died at 6:11 hours today.     Pertinent Labs and Studies  Significant Diagnostic Studies CT Head Wo Contrast  Result Date: 02/13/21 CLINICAL DATA:  [place] EXAM: CT HEAD WITHOUT CONTRAST TECHNIQUE: Contiguous axial images were obtained from the base of the skull through the vertex without intravenous  contrast. COMPARISON:  None. FINDINGS: Brain: No evidence of large-territorial acute infarction. No parenchymal hemorrhage. No mass lesion. No extra-axial collection. No mass effect or midline shift. No hydrocephalus. Basilar cisterns are patent. Vascular: No hyperdense vessel. Skull: No acute fracture or focal lesion. Sinuses/Orbits: Paranasal sinuses and mastoid air cells are clear. The orbits are unremarkable. Other: None. IMPRESSION: No acute intracranial abnormality. Electronically Signed   By: Iven Finn M.D.   On: 02/13/2021 18:10   DG Chest Port 1 View  Result Date: 13-Feb-2021 CLINICAL DATA:  Questionable sepsis.  History of metastatic melanoma EXAM: PORTABLE CHEST 1 VIEW COMPARISON:  October 30, 2020 FINDINGS: The cardiomediastinal silhouette is normal in contour. No pleural effusion. No pneumothorax. No acute pleuroparenchymal abnormality. Visualized abdomen is unremarkable. Marked soft tissue expansion of the LEFT axilla consistent with known metastatic melanoma. No acute osseous abnormality. IMPRESSION: 1. No acute cardiopulmonary abnormality. 2. Marked soft tissue expansion of the LEFT axilla consistent with known metastatic melanoma. Electronically Signed   By: Valentino Saxon MD   On: February 13, 2021 17:32    Microbiology Recent Results (from the past 240 hour(s))  Blood culture (routine single)     Status: None (Preliminary result)   Collection Time: Feb 13, 2021  5:24 PM   Specimen: BLOOD  Result Value Ref Range Status   Specimen Description BLOOD BLOOD LEFT FOREARM  Final   Special Requests   Final    BOTTLES DRAWN AEROBIC AND ANAEROBIC Blood Culture adequate volume   Culture   Final    NO GROWTH 2 DAYS Performed at [facility], [address] [place],  [address]    Report Status PENDING  Incomplete  Culture, blood (single)     Status: None (Preliminary result)   Collection  Time: 01/16/2021  5:24 PM   Specimen: BLOOD  Result Value Ref Range Status    Specimen Description BLOOD RIGHT [facility]  Final   Special Requests   Final    BOTTLES DRAWN AEROBIC AND ANAEROBIC Blood Culture adequate volume   Culture   Final    NO GROWTH 2 DAYS Performed at [facility], [place], [place] [address]    Report Status PENDING  Incomplete    Lab Basic Metabolic Panel: Recent Labs  Lab 01/22/2021 1724 02/10/21 0435  NA 132* 135  K 5.0 3.9  CL 90* 95*  CO2 8* 20*  GLUCOSE 204* 144*  BUN 95* 99*  CREATININE 1.84* 1.37*  CALCIUM 9.7 8.4*  MG  --  2.1  PHOS  --  4.8*   Liver Function Tests: Recent Labs  Lab 01/28/2021 1724 02/10/21 0435  AST 590* 1,006*  ALT 256* 344*  ALKPHOS 469* 413*  BILITOT 2.7* 2.5*  PROT 5.9* 4.9*  ALBUMIN 2.5* 2.1*   No results for input(s): LIPASE, AMYLASE in the last 168 hours. Recent Labs  Lab 01/22/2021 1724  AMMONIA 42*   CBC: Recent Labs  Lab 02/01/2021 1724 02/10/21 0517  WBC 22.3* 8.8  NEUTROABS 17.5*  --   HGB 8.0* 6.1*  HCT 27.1* 19.1*  MCV 79.9* 72.6*  PLT 8* 7*   Cardiac Enzymes: No results for input(s): CKTOTAL, CKMB, CKMBINDEX, TROPONINI in the last 168 hours. Sepsis Labs: Recent Labs  Lab 01/31/2021 1724 01/31/2021 2106 02/10/21 0435 02/10/21 0517 02/10/21 0743  PROCALCITON  --   --  21.90  --   --   WBC 22.3*  --   --  8.8  --   LATICACIDVEN >11.0* >11.0* 7.5*  --  7.6*    Procedures/Operations  None   Nicholas Pruitt 02/16/2021, 7:07 AM

## 2021-03-14 NOTE — Progress Notes (Signed)
[place] donor services:  Reference number [id number].

## 2021-03-14 NOTE — Progress Notes (Signed)
  Chaplain On-Call received page from [place] concerning the parents of the deceased patient.  The parents were in the hospital [place], waiting to be escorted to the [facility] for viewing their son.  Chaplain met the parents and accompanied them to the [place] with Nurses [place] and [place] from [address].  A/C Colletta [place] met [place] at the [place].  We provided much spiritual and emotional support for the parents in their grief, and this Chaplain provided prayer.  Chaplain and Evette escorted the parents to the [place] for their departure.  Chaplain Pollyann Samples M.Div., [facility]

## 2021-03-14 DEATH — deceased

## 2022-04-07 IMAGING — DX DG CHEST 1V PORT
1 series · 1 of 1 positions shown · non-contrast
Comparison: October 30, 2020

CLINICAL DATA: Questionable sepsis.  History of metastatic melanoma

EXAM:
PORTABLE CHEST 1 VIEW

[chest ap]
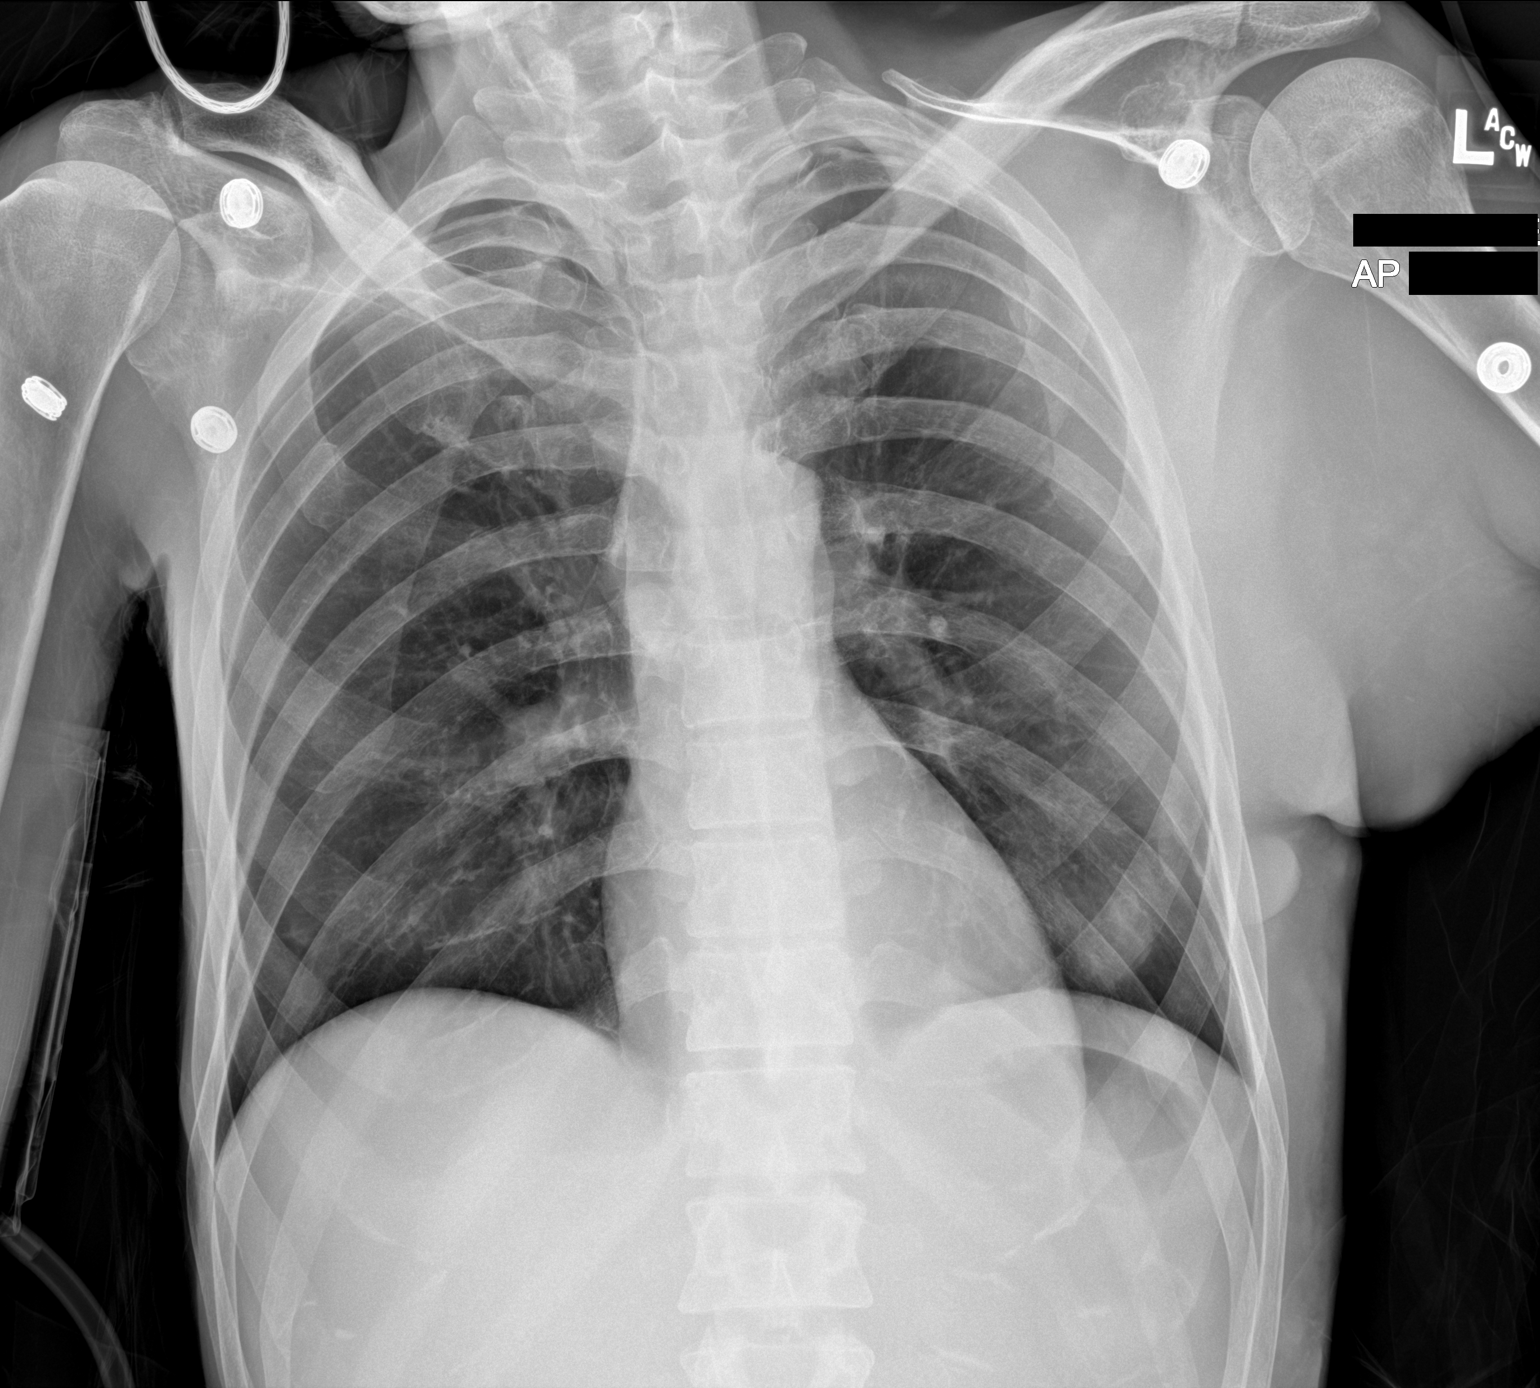

[1 of 1 positions shown; findings below may reference images not displayed]

FINDINGS: The cardiomediastinal silhouette is normal in contour. No pleural
effusion. No pneumothorax. No acute pleuroparenchymal abnormality.
Visualized abdomen is unremarkable. Marked soft tissue expansion of
the LEFT axilla consistent with known metastatic melanoma. No acute
osseous abnormality.
IMPRESSION: 1. No acute cardiopulmonary abnormality.
2. Marked soft tissue expansion of the LEFT axilla consistent with
known metastatic melanoma.

## 2022-04-07 IMAGING — CT CT HEAD W/O CM
4 series · 17 of 47 positions shown, 19 images · non-contrast
Comparison: None.

CLINICAL DATA: Delirium

EXAM:
CT HEAD WITHOUT CONTRAST
TECHNIQUE: Contiguous axial images were obtained from the base of the skull
through the vertex without intravenous contrast.

[Series 2: head bone · axial · 0.40mm/px · z∈[-122,-74]mm · 4 of 72 slices shown]
[im 8/72  bone]
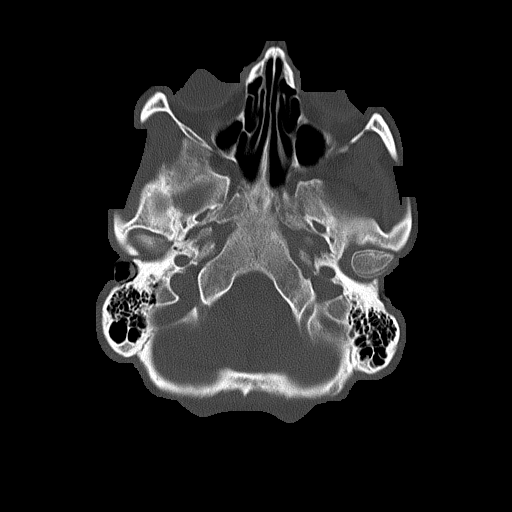
[im 15/72  bone]
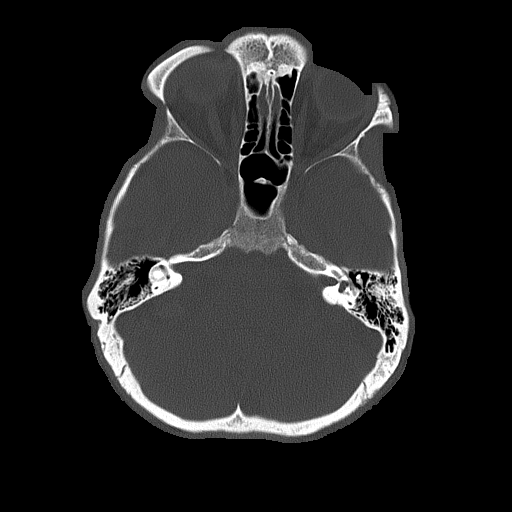
[im 22/72  bone]
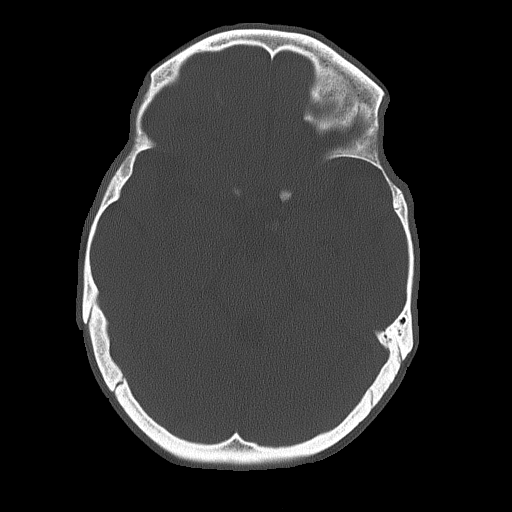
[im 32/72  bone]
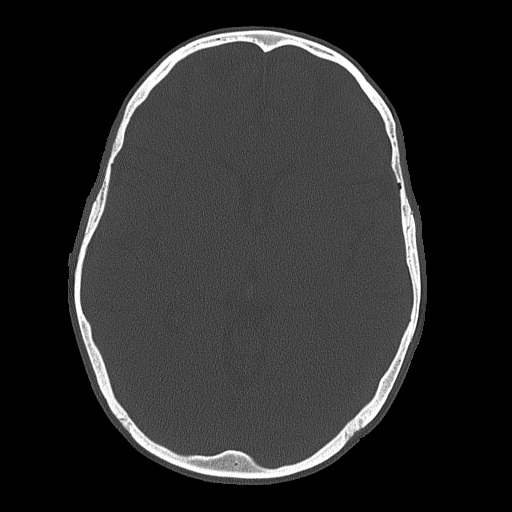

[Series 3: head wo · axial · 0.40mm/px · z∈[-121,-16]mm · 7 of 29 slices shown, 9 images]
[im 4/29  brain]
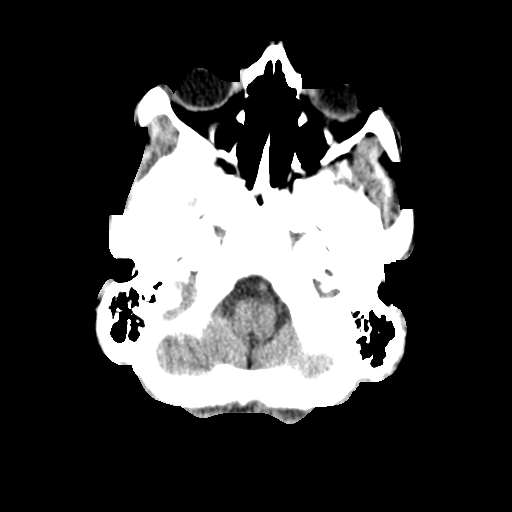
[im 4/29  bone]
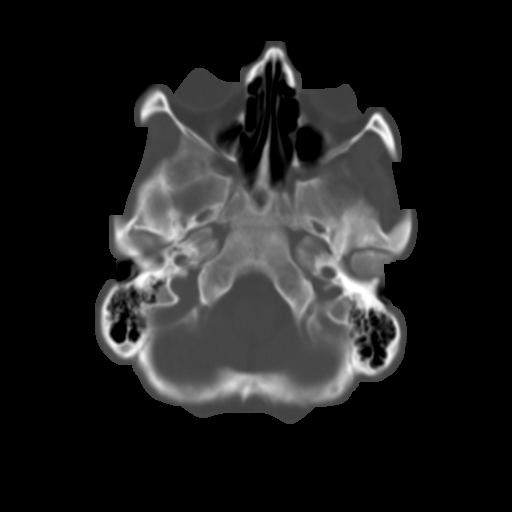
[im 8/29  brain]
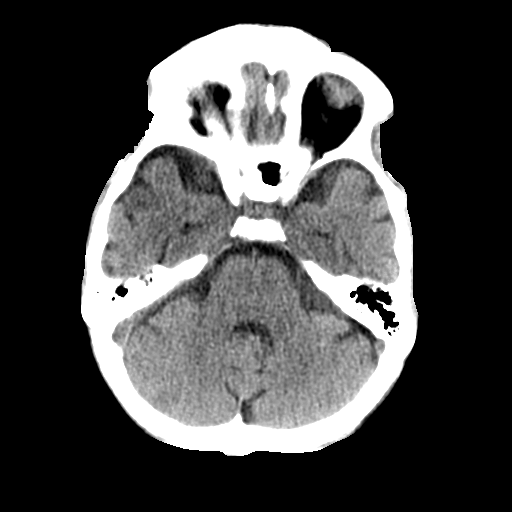
[im 11/29  brain]
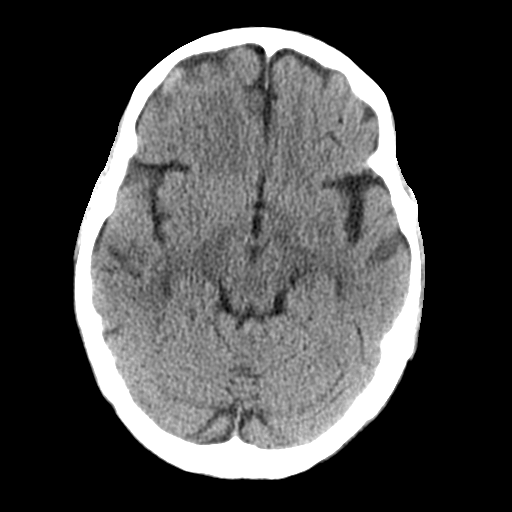
[im 15/29  brain]
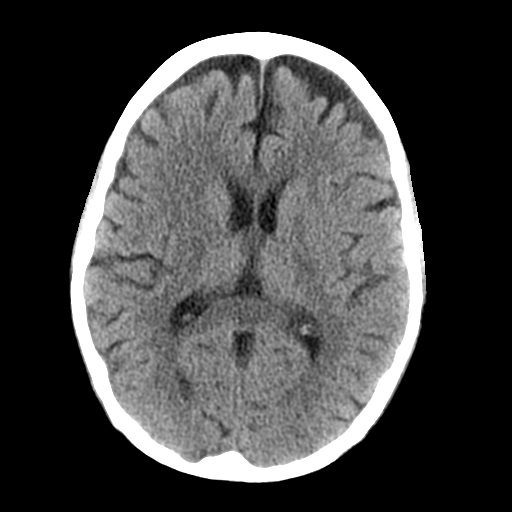
[im 18/29  brain]
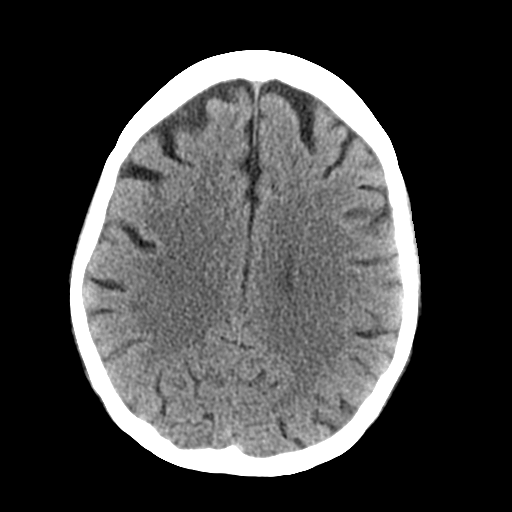
[im 18/29  bone]
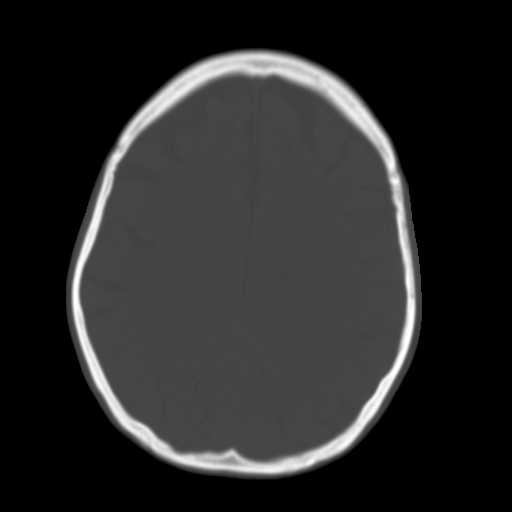
[im 22/29  brain]
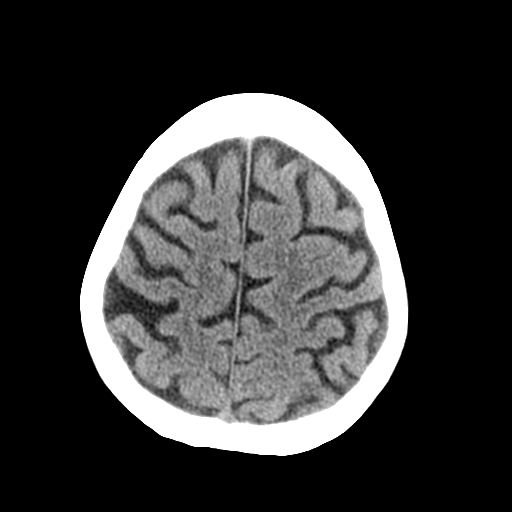
[im 25/29  brain]
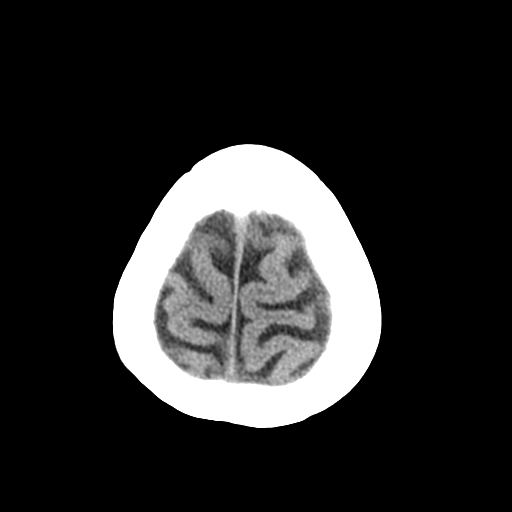

[Series 4: coronal soft tissue · coronal · 0.33mm/px · 3 of 65 slices shown]
[im 22/65  brain]
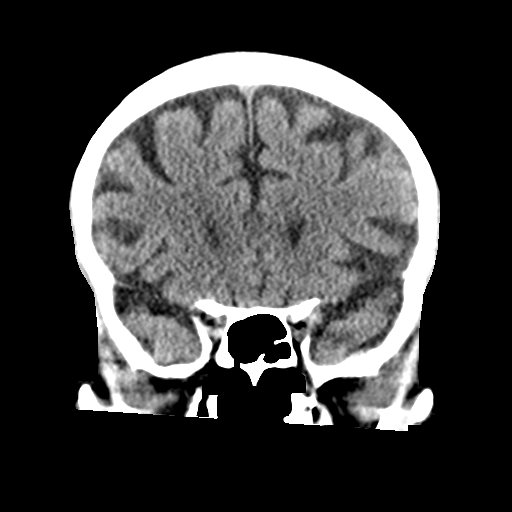
[im 29/65  brain]
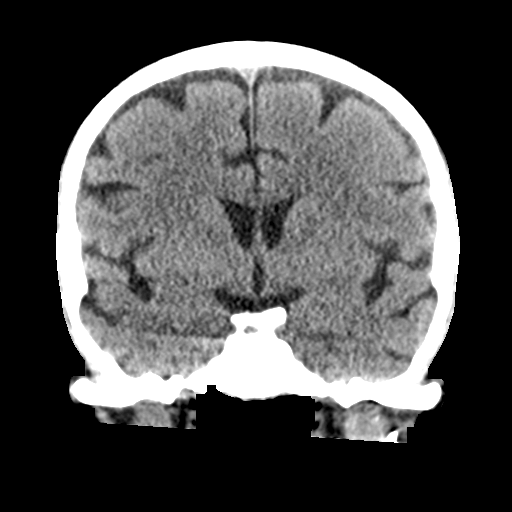
[im 36/65  brain]
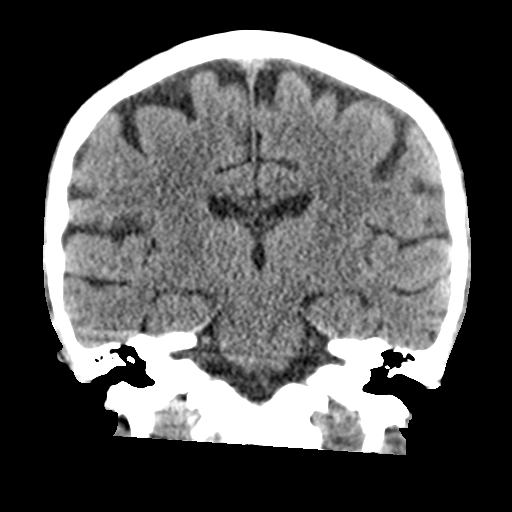

[Series 5: sagittal soft tissue · sagittal · 0.33mm/px · 3 of 51 slices shown]
[im 17/51  brain]
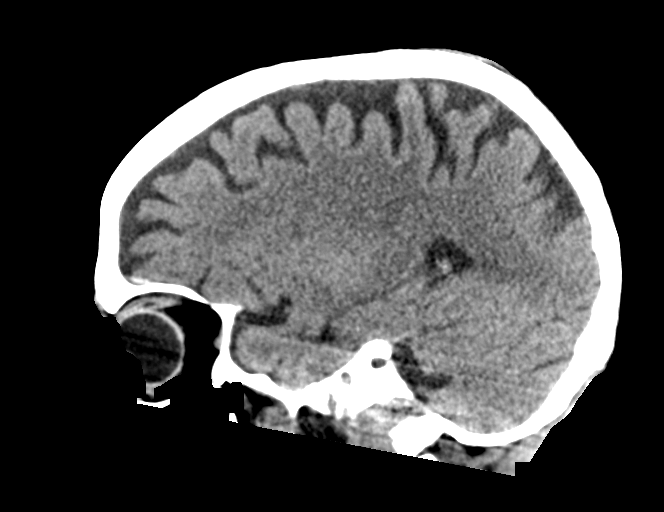
[im 26/51  brain]
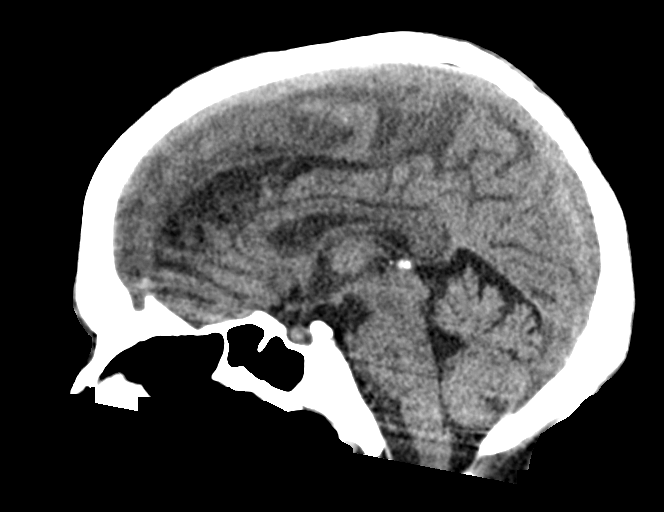
[im 34/51  brain]
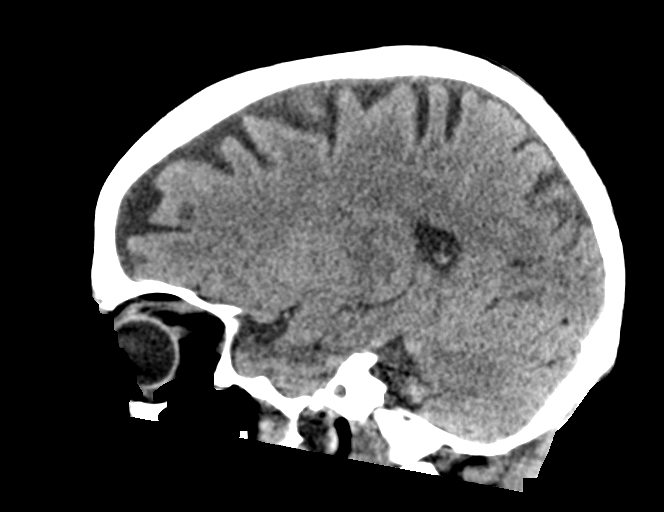

[17 of 47 positions shown; findings below may reference images not displayed]

FINDINGS: Brain:

No evidence of large-territorial acute infarction. No parenchymal
hemorrhage. No mass lesion. No extra-axial collection.

No mass effect or midline shift. No hydrocephalus. Basilar cisterns
are patent.

Vascular: No hyperdense vessel.

Skull: No acute fracture or focal lesion.

Sinuses/Orbits: Paranasal sinuses and mastoid air cells are clear.
The orbits are unremarkable.

Other: None.
IMPRESSION: No acute intracranial abnormality.
